# Patient Record
Sex: Female | Born: 1935 | Race: White | Hispanic: No | Marital: Married | Smoking: Former smoker
Health system: Southern US, Community
[De-identification: ages and names within clinical notes are randomized; demographics above are authoritative.]

---

## 1998-07-14 ENCOUNTER — Ambulatory Visit (HOSPITAL_COMMUNITY): Admission: RE | Admit: 1998-07-14 | Discharge: 1998-07-14 | Payer: Self-pay | Admitting: Endocrinology

## 1999-07-20 ENCOUNTER — Encounter: Payer: Self-pay | Admitting: Endocrinology

## 1999-07-20 ENCOUNTER — Ambulatory Visit (HOSPITAL_COMMUNITY): Admission: RE | Admit: 1999-07-20 | Discharge: 1999-07-20 | Payer: Self-pay | Admitting: Endocrinology

## 2000-03-19 ENCOUNTER — Emergency Department (HOSPITAL_COMMUNITY): Admission: EM | Admit: 2000-03-19 | Discharge: 2000-03-19 | Payer: Self-pay | Admitting: Emergency Medicine

## 2000-07-21 ENCOUNTER — Encounter: Payer: Self-pay | Admitting: Endocrinology

## 2000-07-21 ENCOUNTER — Ambulatory Visit (HOSPITAL_COMMUNITY): Admission: RE | Admit: 2000-07-21 | Discharge: 2000-07-21 | Payer: Self-pay | Admitting: Endocrinology

## 2001-07-24 ENCOUNTER — Encounter: Admission: RE | Admit: 2001-07-24 | Discharge: 2001-07-24 | Payer: Self-pay | Admitting: Family Medicine

## 2001-07-24 ENCOUNTER — Encounter: Payer: Self-pay | Admitting: Family Medicine

## 2001-08-09 ENCOUNTER — Ambulatory Visit (HOSPITAL_COMMUNITY): Admission: RE | Admit: 2001-08-09 | Discharge: 2001-08-09 | Payer: Self-pay | Admitting: Family Medicine

## 2001-08-09 ENCOUNTER — Encounter: Payer: Self-pay | Admitting: Family Medicine

## 2002-08-12 ENCOUNTER — Encounter: Payer: Self-pay | Admitting: Family Medicine

## 2002-08-12 ENCOUNTER — Ambulatory Visit (HOSPITAL_COMMUNITY): Admission: RE | Admit: 2002-08-12 | Discharge: 2002-08-12 | Payer: Self-pay | Admitting: Family Medicine

## 2003-08-27 ENCOUNTER — Ambulatory Visit (HOSPITAL_COMMUNITY): Admission: RE | Admit: 2003-08-27 | Discharge: 2003-08-27 | Payer: Self-pay | Admitting: Family Medicine

## 2004-08-27 ENCOUNTER — Ambulatory Visit (HOSPITAL_COMMUNITY): Admission: RE | Admit: 2004-08-27 | Discharge: 2004-08-27 | Payer: Self-pay | Admitting: Family Medicine

## 2005-09-16 ENCOUNTER — Ambulatory Visit (HOSPITAL_COMMUNITY): Admission: RE | Admit: 2005-09-16 | Discharge: 2005-09-16 | Payer: Self-pay | Admitting: Family Medicine

## 2006-06-27 ENCOUNTER — Other Ambulatory Visit: Admission: RE | Admit: 2006-06-27 | Discharge: 2006-06-27 | Payer: Self-pay | Admitting: Family Medicine

## 2006-09-26 ENCOUNTER — Ambulatory Visit (HOSPITAL_COMMUNITY): Admission: RE | Admit: 2006-09-26 | Discharge: 2006-09-26 | Payer: Self-pay | Admitting: Family Medicine

## 2006-11-14 ENCOUNTER — Encounter: Admission: RE | Admit: 2006-11-14 | Discharge: 2006-11-14 | Payer: Self-pay | Admitting: Family Medicine

## 2007-10-09 ENCOUNTER — Ambulatory Visit (HOSPITAL_COMMUNITY): Admission: RE | Admit: 2007-10-09 | Discharge: 2007-10-09 | Payer: Self-pay | Admitting: Family Medicine

## 2008-05-13 ENCOUNTER — Ambulatory Visit: Payer: Self-pay | Admitting: Internal Medicine

## 2008-10-21 ENCOUNTER — Ambulatory Visit (HOSPITAL_COMMUNITY): Admission: RE | Admit: 2008-10-21 | Discharge: 2008-10-21 | Payer: Self-pay | Admitting: Family Medicine

## 2009-07-02 ENCOUNTER — Encounter: Admission: RE | Admit: 2009-07-02 | Discharge: 2009-07-02 | Payer: Self-pay | Admitting: Family Medicine

## 2009-11-11 ENCOUNTER — Ambulatory Visit (HOSPITAL_COMMUNITY): Admission: RE | Admit: 2009-11-11 | Discharge: 2009-11-11 | Payer: Self-pay | Admitting: Family Medicine

## 2010-07-30 ENCOUNTER — Encounter
Admission: RE | Admit: 2010-07-30 | Discharge: 2010-07-30 | Payer: Self-pay | Source: Home / Self Care | Attending: Family Medicine | Admitting: Family Medicine

## 2010-08-01 ENCOUNTER — Emergency Department (HOSPITAL_COMMUNITY)
Admission: EM | Admit: 2010-08-01 | Discharge: 2010-08-01 | Payer: Self-pay | Source: Home / Self Care | Admitting: Emergency Medicine

## 2010-09-08 ENCOUNTER — Encounter
Admission: RE | Admit: 2010-09-08 | Discharge: 2010-09-08 | Payer: Self-pay | Source: Home / Self Care | Attending: Gastroenterology | Admitting: Gastroenterology

## 2010-10-12 ENCOUNTER — Other Ambulatory Visit: Payer: Self-pay | Admitting: Family Medicine

## 2010-10-12 DIAGNOSIS — M549 Dorsalgia, unspecified: Secondary | ICD-10-CM

## 2010-10-17 ENCOUNTER — Ambulatory Visit
Admission: RE | Admit: 2010-10-17 | Discharge: 2010-10-17 | Disposition: A | Discharge status: Home / Self Care | Payer: Medicare Other | Source: Ambulatory Visit | Attending: Family Medicine | Admitting: Family Medicine

## 2010-10-17 DIAGNOSIS — M549 Dorsalgia, unspecified: Secondary | ICD-10-CM

## 2010-11-02 LAB — DIFFERENTIAL
Basophils Absolute: 0 10*3/uL (ref 0.0–0.1)
Basophils Relative: 1 % (ref 0–1)
Eosinophils Absolute: 0 10*3/uL (ref 0.0–0.7)
Lymphocytes Relative: 34 % (ref 12–46)
Neutro Abs: 2.2 10*3/uL (ref 1.7–7.7)

## 2010-11-02 LAB — CBC
MCHC: 34.8 g/dL (ref 30.0–36.0)
MCV: 92.4 fL (ref 78.0–100.0)
RDW: 12.9 % (ref 11.5–15.5)

## 2010-11-02 LAB — URINE CULTURE
Colony Count: NO GROWTH
Culture  Setup Time: 201112112112
Culture: NO GROWTH

## 2010-11-02 LAB — POCT CARDIAC MARKERS: CKMB, poc: <1 ng/mL — ABNORMAL LOW (ref 1.0–8.0)

## 2010-11-02 LAB — URINALYSIS, ROUTINE W REFLEX MICROSCOPIC
Glucose, UA: NEGATIVE mg/dL
Nitrite: NEGATIVE
Protein, ur: NEGATIVE mg/dL
Specific Gravity, Urine: 1.008 (ref 1.005–1.030)

## 2010-11-02 LAB — COMPREHENSIVE METABOLIC PANEL
ALT: 25 U/L (ref 0–35)
AST: 26 U/L (ref 0–37)
BUN: 10 mg/dL (ref 6–23)
CO2: 28 mEq/L (ref 19–32)
Creatinine, Ser: 0.85 mg/dL (ref 0.4–1.2)
GFR calc Af Amer: >60 mL/min (ref >60)
Glucose, Bld: 104 mg/dL — ABNORMAL HIGH (ref 70–99)
Potassium: 3.6 mEq/L (ref 3.5–5.1)
Total Protein: 6.6 g/dL (ref 6.0–8.3)

## 2010-11-02 LAB — LIPASE, BLOOD: Lipase: 27 U/L (ref 11–59)

## 2010-11-02 LAB — URINE MICROSCOPIC-ADD ON

## 2010-12-31 ENCOUNTER — Other Ambulatory Visit: Payer: Self-pay | Admitting: Family Medicine

## 2010-12-31 ENCOUNTER — Ambulatory Visit
Admission: RE | Admit: 2010-12-31 | Discharge: 2010-12-31 | Disposition: A | Discharge status: Home / Self Care | Payer: Medicare Other | Source: Ambulatory Visit | Attending: Family Medicine | Admitting: Family Medicine

## 2010-12-31 DIAGNOSIS — R05 Cough: Secondary | ICD-10-CM

## 2010-12-31 DIAGNOSIS — R059 Cough, unspecified: Secondary | ICD-10-CM

## 2011-01-28 ENCOUNTER — Other Ambulatory Visit (HOSPITAL_COMMUNITY): Payer: Self-pay | Admitting: Family Medicine

## 2011-01-28 DIAGNOSIS — Z1231 Encounter for screening mammogram for malignant neoplasm of breast: Secondary | ICD-10-CM

## 2011-01-31 ENCOUNTER — Ambulatory Visit (HOSPITAL_COMMUNITY)
Admission: RE | Admit: 2011-01-31 | Discharge: 2011-01-31 | Disposition: A | Discharge status: Home / Self Care | Payer: Medicare Other | Source: Ambulatory Visit | Attending: Family Medicine | Admitting: Family Medicine

## 2011-01-31 DIAGNOSIS — Z1231 Encounter for screening mammogram for malignant neoplasm of breast: Secondary | ICD-10-CM | POA: Insufficient documentation

## 2011-05-22 ENCOUNTER — Emergency Department (HOSPITAL_COMMUNITY)
Admission: EM | Admit: 2011-05-22 | Discharge: 2011-05-22 | Disposition: A | Discharge status: Home / Self Care | Payer: Medicare Other | Attending: Emergency Medicine | Admitting: Emergency Medicine

## 2011-05-22 DIAGNOSIS — E7800 Pure hypercholesterolemia, unspecified: Secondary | ICD-10-CM | POA: Insufficient documentation

## 2011-05-22 DIAGNOSIS — R51 Headache: Secondary | ICD-10-CM | POA: Insufficient documentation

## 2011-05-22 DIAGNOSIS — I440 Atrioventricular block, first degree: Secondary | ICD-10-CM | POA: Insufficient documentation

## 2011-05-22 DIAGNOSIS — Z79899 Other long term (current) drug therapy: Secondary | ICD-10-CM | POA: Insufficient documentation

## 2011-05-22 DIAGNOSIS — I10 Essential (primary) hypertension: Secondary | ICD-10-CM | POA: Insufficient documentation

## 2011-05-22 DIAGNOSIS — M542 Cervicalgia: Secondary | ICD-10-CM | POA: Insufficient documentation

## 2011-05-26 ENCOUNTER — Other Ambulatory Visit: Payer: Self-pay | Admitting: Anesthesiology

## 2011-05-26 DIAGNOSIS — M542 Cervicalgia: Secondary | ICD-10-CM

## 2011-05-29 ENCOUNTER — Ambulatory Visit
Admission: RE | Admit: 2011-05-29 | Discharge: 2011-05-29 | Disposition: A | Discharge status: Home / Self Care | Payer: Medicare Other | Source: Ambulatory Visit | Attending: Anesthesiology | Admitting: Anesthesiology

## 2011-05-29 DIAGNOSIS — M542 Cervicalgia: Secondary | ICD-10-CM

## 2011-08-25 DIAGNOSIS — M545 Low back pain, unspecified: Secondary | ICD-10-CM | POA: Diagnosis not present

## 2011-08-25 DIAGNOSIS — M542 Cervicalgia: Secondary | ICD-10-CM | POA: Diagnosis not present

## 2011-09-07 DIAGNOSIS — R059 Cough, unspecified: Secondary | ICD-10-CM | POA: Diagnosis not present

## 2011-09-07 DIAGNOSIS — R05 Cough: Secondary | ICD-10-CM | POA: Diagnosis not present

## 2011-09-19 DIAGNOSIS — M546 Pain in thoracic spine: Secondary | ICD-10-CM | POA: Diagnosis not present

## 2011-09-19 DIAGNOSIS — IMO0002 Reserved for concepts with insufficient information to code with codable children: Secondary | ICD-10-CM | POA: Diagnosis not present

## 2011-09-23 DIAGNOSIS — H9209 Otalgia, unspecified ear: Secondary | ICD-10-CM | POA: Diagnosis not present

## 2011-09-29 DIAGNOSIS — H9319 Tinnitus, unspecified ear: Secondary | ICD-10-CM | POA: Diagnosis not present

## 2011-09-29 DIAGNOSIS — H903 Sensorineural hearing loss, bilateral: Secondary | ICD-10-CM | POA: Diagnosis not present

## 2011-09-30 DIAGNOSIS — M546 Pain in thoracic spine: Secondary | ICD-10-CM | POA: Diagnosis not present

## 2011-10-13 DIAGNOSIS — F411 Generalized anxiety disorder: Secondary | ICD-10-CM | POA: Diagnosis not present

## 2011-10-13 DIAGNOSIS — I10 Essential (primary) hypertension: Secondary | ICD-10-CM | POA: Diagnosis not present

## 2011-10-13 DIAGNOSIS — H9319 Tinnitus, unspecified ear: Secondary | ICD-10-CM | POA: Diagnosis not present

## 2011-10-17 DIAGNOSIS — H9319 Tinnitus, unspecified ear: Secondary | ICD-10-CM | POA: Diagnosis not present

## 2011-10-24 DIAGNOSIS — M546 Pain in thoracic spine: Secondary | ICD-10-CM | POA: Diagnosis not present

## 2011-10-24 DIAGNOSIS — M542 Cervicalgia: Secondary | ICD-10-CM | POA: Diagnosis not present

## 2011-11-09 DIAGNOSIS — G43109 Migraine with aura, not intractable, without status migrainosus: Secondary | ICD-10-CM | POA: Diagnosis not present

## 2011-11-09 DIAGNOSIS — I10 Essential (primary) hypertension: Secondary | ICD-10-CM | POA: Diagnosis not present

## 2011-11-09 DIAGNOSIS — J309 Allergic rhinitis, unspecified: Secondary | ICD-10-CM | POA: Diagnosis not present

## 2011-11-22 DIAGNOSIS — H9319 Tinnitus, unspecified ear: Secondary | ICD-10-CM | POA: Diagnosis not present

## 2011-11-22 DIAGNOSIS — H9209 Otalgia, unspecified ear: Secondary | ICD-10-CM | POA: Diagnosis not present

## 2011-11-22 DIAGNOSIS — H905 Unspecified sensorineural hearing loss: Secondary | ICD-10-CM | POA: Diagnosis not present

## 2011-12-21 DIAGNOSIS — M546 Pain in thoracic spine: Secondary | ICD-10-CM | POA: Diagnosis not present

## 2011-12-21 DIAGNOSIS — M542 Cervicalgia: Secondary | ICD-10-CM | POA: Diagnosis not present

## 2012-01-20 DIAGNOSIS — M76829 Posterior tibial tendinitis, unspecified leg: Secondary | ICD-10-CM | POA: Diagnosis not present

## 2012-01-30 DIAGNOSIS — M5030 Other cervical disc degeneration, unspecified cervical region: Secondary | ICD-10-CM | POA: Diagnosis not present

## 2012-01-30 DIAGNOSIS — M5412 Radiculopathy, cervical region: Secondary | ICD-10-CM | POA: Diagnosis not present

## 2012-02-06 DIAGNOSIS — H5210 Myopia, unspecified eye: Secondary | ICD-10-CM | POA: Diagnosis not present

## 2012-02-06 DIAGNOSIS — H2510 Age-related nuclear cataract, unspecified eye: Secondary | ICD-10-CM | POA: Diagnosis not present

## 2012-02-06 DIAGNOSIS — H52229 Regular astigmatism, unspecified eye: Secondary | ICD-10-CM | POA: Diagnosis not present

## 2012-02-08 DIAGNOSIS — E7800 Pure hypercholesterolemia, unspecified: Secondary | ICD-10-CM | POA: Diagnosis not present

## 2012-02-10 DIAGNOSIS — E7800 Pure hypercholesterolemia, unspecified: Secondary | ICD-10-CM | POA: Diagnosis not present

## 2012-02-10 DIAGNOSIS — I10 Essential (primary) hypertension: Secondary | ICD-10-CM | POA: Diagnosis not present

## 2012-02-22 IMAGING — CT CT ABD-PELV W/ CM
3 of 5 series · 13 of 36 positions shown, 19 images · IV contrast (READICAT/WATER & [ID] OMNI 300)
Comparison: [HOSPITAL] at [REDACTED] [HOSPITAL] abdominal pelvic
CT 07/02/2009.

CLINICAL DATA: Diffuse abdominal pain with diverticular disease.
Antibiotic treatment.  Hysterectomy.  Appendectomy.

CT ABDOMEN AND PELVIS WITH CONTRAST
TECHNIQUE: Multidetector CT imaging of the abdomen and pelvis was
performed following the standard protocol during bolus
administration of intravenous contrast.
Contrast: Intravenous 100 ml Qmnipaque-SJJ.

[Series 3: routine abdomen · axial · 0.70mm/px · z∈[-309,-9]mm · 7 of 81 slices shown, 12 images]
[im 11/81  soft-tissue]
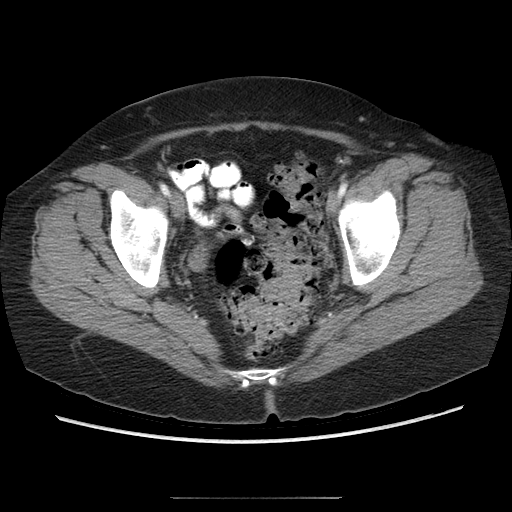
[im 11/81  bone]
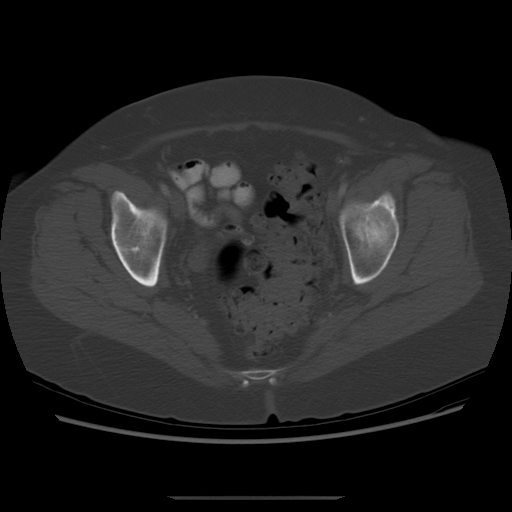
[im 21/81  soft-tissue]
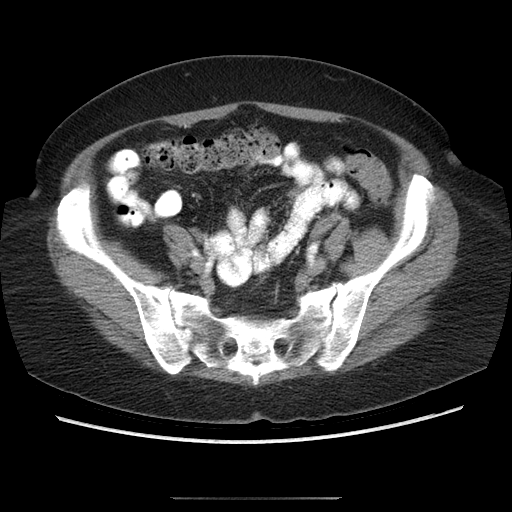
[im 31/81  soft-tissue]
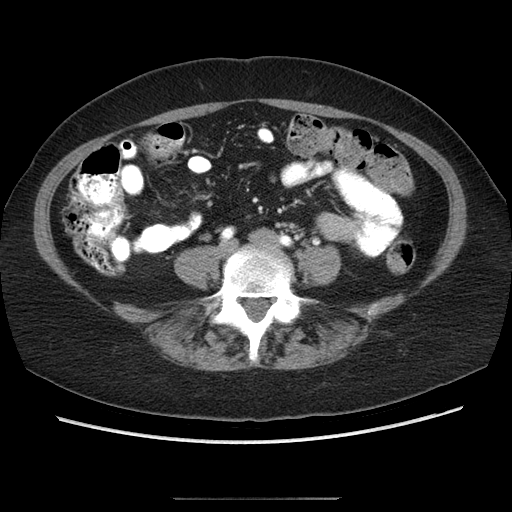
[im 41/81  soft-tissue]
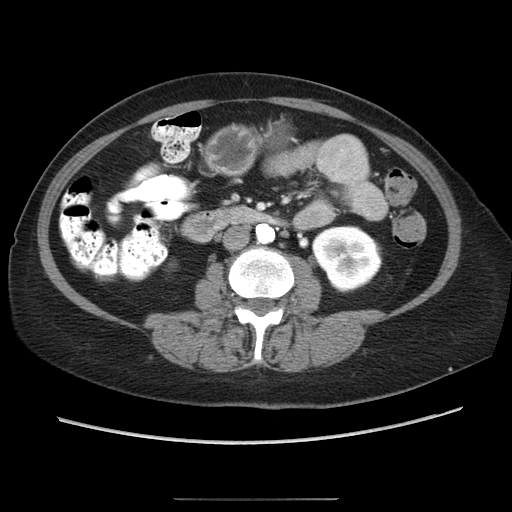
[im 41/81  lung]
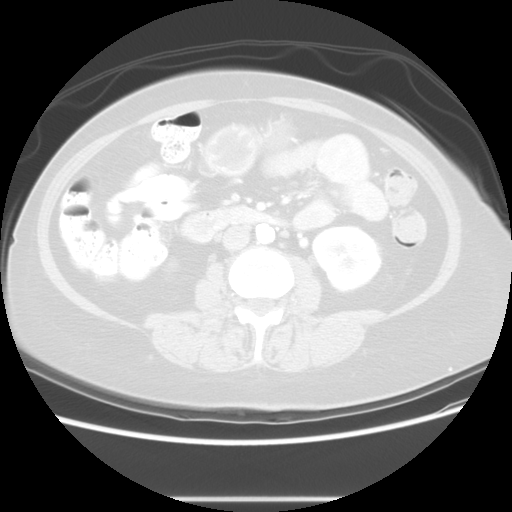
[im 51/81  soft-tissue]
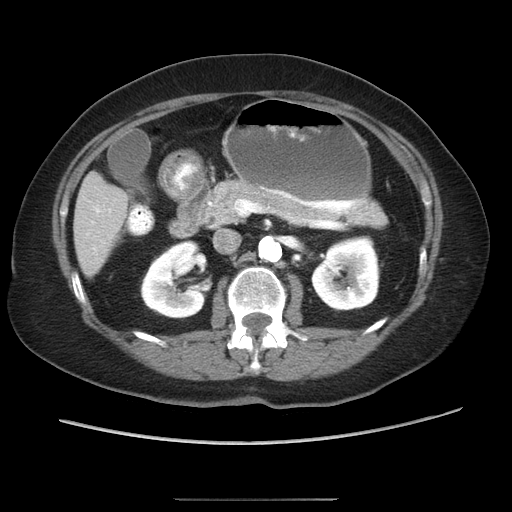
[im 51/81  lung]
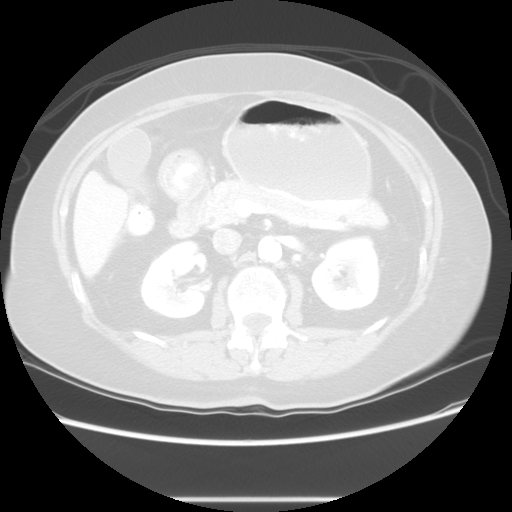
[im 61/81  soft-tissue]
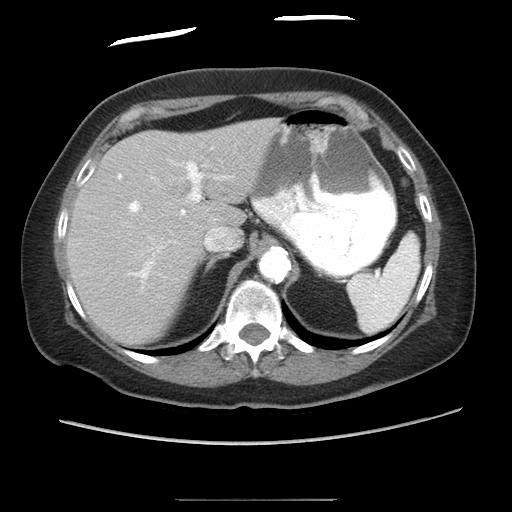
[im 61/81  lung]
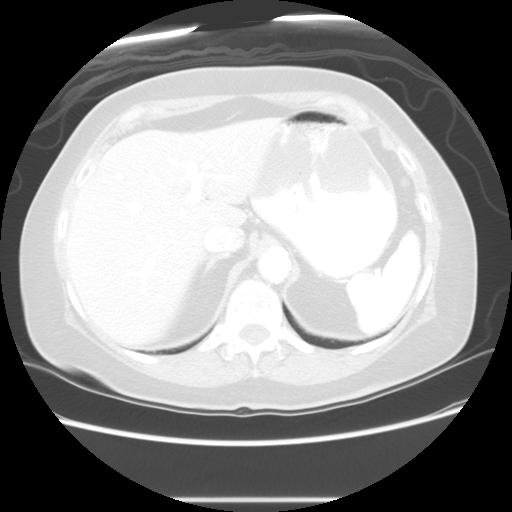
[im 71/81  soft-tissue]
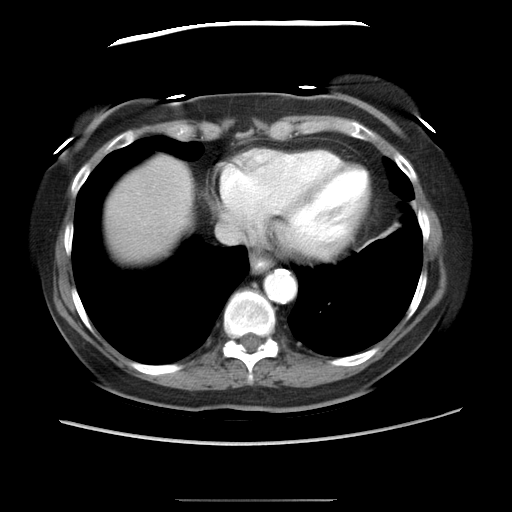
[im 71/81  lung]
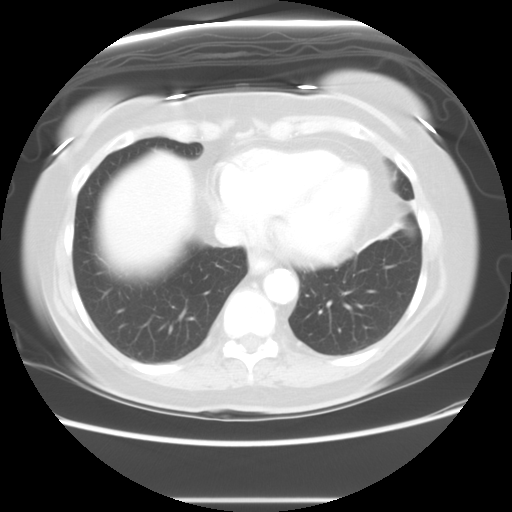

[Series 601: coronal body · coronal · 0.89mm/px · 1 of 108 slices shown, 2 images]
[im 36/108  soft-tissue]
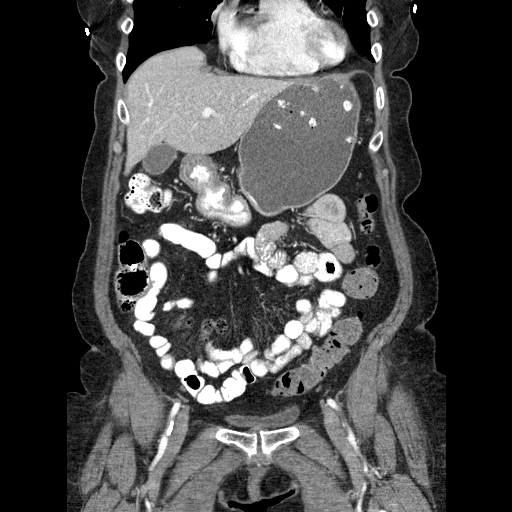
[im 36/108  bone]
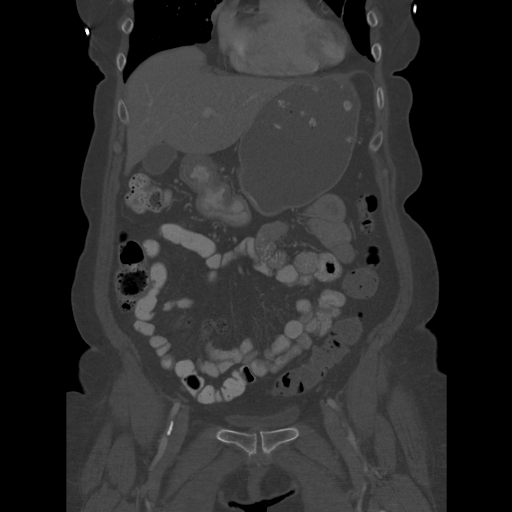

[Series 602: sagittal body · sagittal · 0.89mm/px · 5 of 145 slices shown]
[im 10/145  soft-tissue]
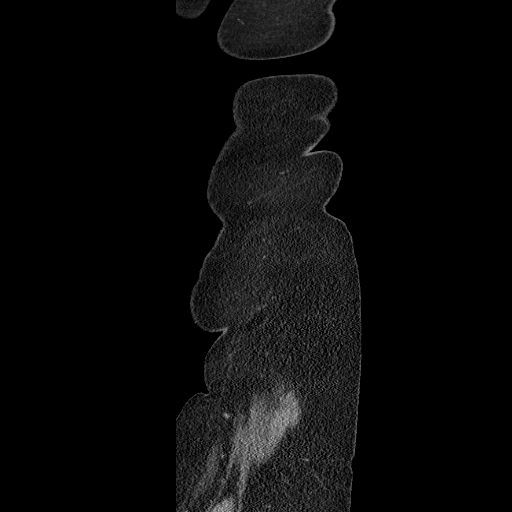
[im 28/145  soft-tissue]
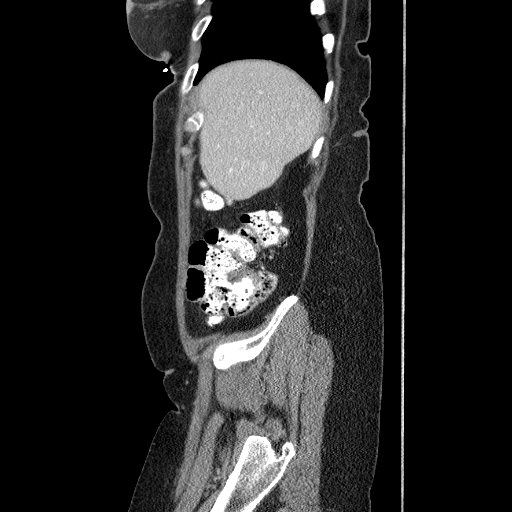
[im 46/145  soft-tissue]
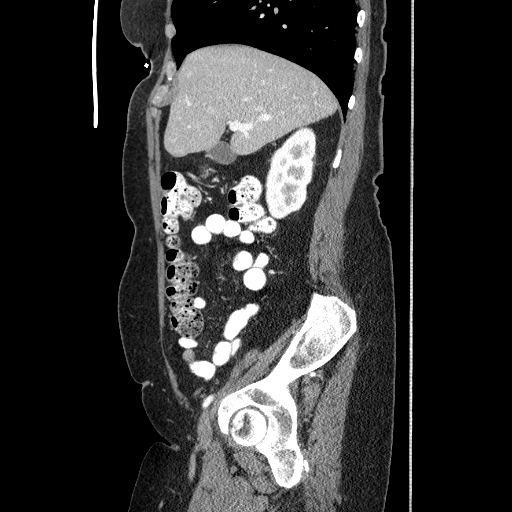
[im 64/145  soft-tissue]
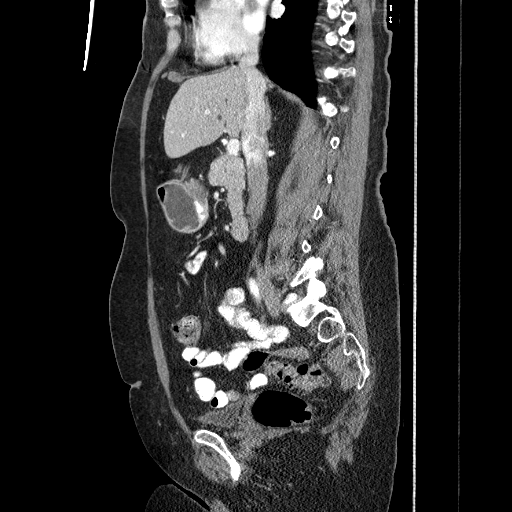
[im 82/145  soft-tissue]
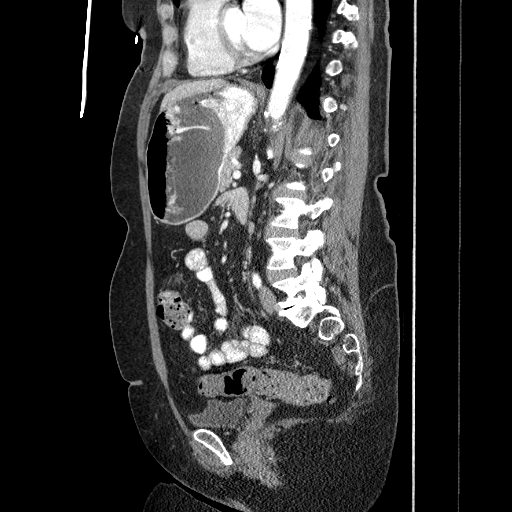

[13 of 36 positions shown; findings below may reference images not displayed]

FINDINGS: Current moderate retained colonic feces without
intestinal obstruction or inflammatory bowel disease visualized.
Stable slight scattered diverticulosis most marked at the sigmoid
colon levels seen with no evidence for acute diverticulitis,
abscess, free fluid or adenopathy.  Stable 5 mm lateral segment
left hepatic cyst, small hiatus hernia, moderate atheromatous
vascular calcification with normal sized abdominal aorta,
hysterectomy, appendectomy, slight to moderate degenerative disc
disease L5-S1 visualized.  Remaining abdominal and pelvic organs
appear normal with slight left inferior lingular linear scarring or
atelectasis with lung bases otherwise clear.
IMPRESSION: 1.  Current moderate retained colonic feces may represent
constipation.
2.  Stable diverticulosis most marked sigmoid level without acute
diverticulitis or complicating features.
3.  Stable benign appearing chronic and postoperative findings.
3.  No acute abnormality.

## 2012-02-29 DIAGNOSIS — M542 Cervicalgia: Secondary | ICD-10-CM | POA: Diagnosis not present

## 2012-04-04 DIAGNOSIS — M76829 Posterior tibial tendinitis, unspecified leg: Secondary | ICD-10-CM | POA: Diagnosis not present

## 2012-04-07 DIAGNOSIS — M19079 Primary osteoarthritis, unspecified ankle and foot: Secondary | ICD-10-CM | POA: Diagnosis not present

## 2012-04-18 DIAGNOSIS — M25579 Pain in unspecified ankle and joints of unspecified foot: Secondary | ICD-10-CM | POA: Diagnosis not present

## 2012-04-18 DIAGNOSIS — M76829 Posterior tibial tendinitis, unspecified leg: Secondary | ICD-10-CM | POA: Diagnosis not present

## 2012-04-25 DIAGNOSIS — M25579 Pain in unspecified ankle and joints of unspecified foot: Secondary | ICD-10-CM | POA: Diagnosis not present

## 2012-04-25 DIAGNOSIS — M76829 Posterior tibial tendinitis, unspecified leg: Secondary | ICD-10-CM | POA: Diagnosis not present

## 2012-04-26 DIAGNOSIS — M25579 Pain in unspecified ankle and joints of unspecified foot: Secondary | ICD-10-CM | POA: Diagnosis not present

## 2012-04-26 DIAGNOSIS — M76829 Posterior tibial tendinitis, unspecified leg: Secondary | ICD-10-CM | POA: Diagnosis not present

## 2012-05-01 DIAGNOSIS — M25579 Pain in unspecified ankle and joints of unspecified foot: Secondary | ICD-10-CM | POA: Diagnosis not present

## 2012-05-01 DIAGNOSIS — M76829 Posterior tibial tendinitis, unspecified leg: Secondary | ICD-10-CM | POA: Diagnosis not present

## 2012-05-03 DIAGNOSIS — M25579 Pain in unspecified ankle and joints of unspecified foot: Secondary | ICD-10-CM | POA: Diagnosis not present

## 2012-05-03 DIAGNOSIS — M76829 Posterior tibial tendinitis, unspecified leg: Secondary | ICD-10-CM | POA: Diagnosis not present

## 2012-05-09 DIAGNOSIS — M25579 Pain in unspecified ankle and joints of unspecified foot: Secondary | ICD-10-CM | POA: Diagnosis not present

## 2012-05-09 DIAGNOSIS — M76829 Posterior tibial tendinitis, unspecified leg: Secondary | ICD-10-CM | POA: Diagnosis not present

## 2012-05-10 DIAGNOSIS — M25579 Pain in unspecified ankle and joints of unspecified foot: Secondary | ICD-10-CM | POA: Diagnosis not present

## 2012-05-10 DIAGNOSIS — M76829 Posterior tibial tendinitis, unspecified leg: Secondary | ICD-10-CM | POA: Diagnosis not present

## 2012-05-11 DIAGNOSIS — E7800 Pure hypercholesterolemia, unspecified: Secondary | ICD-10-CM | POA: Diagnosis not present

## 2012-05-16 DIAGNOSIS — M76829 Posterior tibial tendinitis, unspecified leg: Secondary | ICD-10-CM | POA: Diagnosis not present

## 2012-05-16 DIAGNOSIS — M25579 Pain in unspecified ankle and joints of unspecified foot: Secondary | ICD-10-CM | POA: Diagnosis not present

## 2012-05-23 DIAGNOSIS — M76829 Posterior tibial tendinitis, unspecified leg: Secondary | ICD-10-CM | POA: Diagnosis not present

## 2012-05-23 DIAGNOSIS — M25579 Pain in unspecified ankle and joints of unspecified foot: Secondary | ICD-10-CM | POA: Diagnosis not present

## 2012-05-25 DIAGNOSIS — M76829 Posterior tibial tendinitis, unspecified leg: Secondary | ICD-10-CM | POA: Diagnosis not present

## 2012-05-25 DIAGNOSIS — M25579 Pain in unspecified ankle and joints of unspecified foot: Secondary | ICD-10-CM | POA: Diagnosis not present

## 2012-05-30 DIAGNOSIS — M25579 Pain in unspecified ankle and joints of unspecified foot: Secondary | ICD-10-CM | POA: Diagnosis not present

## 2012-05-30 DIAGNOSIS — M76829 Posterior tibial tendinitis, unspecified leg: Secondary | ICD-10-CM | POA: Diagnosis not present

## 2012-05-31 DIAGNOSIS — M76829 Posterior tibial tendinitis, unspecified leg: Secondary | ICD-10-CM | POA: Diagnosis not present

## 2012-05-31 DIAGNOSIS — M25579 Pain in unspecified ankle and joints of unspecified foot: Secondary | ICD-10-CM | POA: Diagnosis not present

## 2012-06-20 DIAGNOSIS — R42 Dizziness and giddiness: Secondary | ICD-10-CM | POA: Diagnosis not present

## 2012-07-04 DIAGNOSIS — L821 Other seborrheic keratosis: Secondary | ICD-10-CM | POA: Diagnosis not present

## 2012-07-04 DIAGNOSIS — D235 Other benign neoplasm of skin of trunk: Secondary | ICD-10-CM | POA: Diagnosis not present

## 2012-07-04 DIAGNOSIS — L820 Inflamed seborrheic keratosis: Secondary | ICD-10-CM | POA: Diagnosis not present

## 2012-07-04 DIAGNOSIS — D485 Neoplasm of uncertain behavior of skin: Secondary | ICD-10-CM | POA: Diagnosis not present

## 2012-07-04 DIAGNOSIS — Z85828 Personal history of other malignant neoplasm of skin: Secondary | ICD-10-CM | POA: Diagnosis not present

## 2012-08-10 DIAGNOSIS — E7800 Pure hypercholesterolemia, unspecified: Secondary | ICD-10-CM | POA: Diagnosis not present

## 2012-08-10 DIAGNOSIS — F411 Generalized anxiety disorder: Secondary | ICD-10-CM | POA: Diagnosis not present

## 2012-08-10 DIAGNOSIS — I10 Essential (primary) hypertension: Secondary | ICD-10-CM | POA: Diagnosis not present

## 2012-08-10 DIAGNOSIS — IMO0002 Reserved for concepts with insufficient information to code with codable children: Secondary | ICD-10-CM | POA: Diagnosis not present

## 2012-08-10 DIAGNOSIS — K5732 Diverticulitis of large intestine without perforation or abscess without bleeding: Secondary | ICD-10-CM | POA: Diagnosis not present

## 2012-09-25 DIAGNOSIS — K5732 Diverticulitis of large intestine without perforation or abscess without bleeding: Secondary | ICD-10-CM | POA: Diagnosis not present

## 2012-11-13 DIAGNOSIS — J309 Allergic rhinitis, unspecified: Secondary | ICD-10-CM | POA: Diagnosis not present

## 2012-11-13 DIAGNOSIS — Z23 Encounter for immunization: Secondary | ICD-10-CM | POA: Diagnosis not present

## 2012-11-13 DIAGNOSIS — G43109 Migraine with aura, not intractable, without status migrainosus: Secondary | ICD-10-CM | POA: Diagnosis not present

## 2012-11-13 DIAGNOSIS — I10 Essential (primary) hypertension: Secondary | ICD-10-CM | POA: Diagnosis not present

## 2012-11-13 DIAGNOSIS — F411 Generalized anxiety disorder: Secondary | ICD-10-CM | POA: Diagnosis not present

## 2012-11-13 DIAGNOSIS — IMO0002 Reserved for concepts with insufficient information to code with codable children: Secondary | ICD-10-CM | POA: Diagnosis not present

## 2012-11-13 DIAGNOSIS — Z0000 Encounter for general adult medical examination without abnormal findings: Secondary | ICD-10-CM | POA: Diagnosis not present

## 2012-11-13 DIAGNOSIS — E7800 Pure hypercholesterolemia, unspecified: Secondary | ICD-10-CM | POA: Diagnosis not present

## 2012-11-16 ENCOUNTER — Other Ambulatory Visit (HOSPITAL_COMMUNITY): Payer: Self-pay | Admitting: Family Medicine

## 2012-11-16 DIAGNOSIS — Z1231 Encounter for screening mammogram for malignant neoplasm of breast: Secondary | ICD-10-CM

## 2012-11-19 ENCOUNTER — Ambulatory Visit (HOSPITAL_COMMUNITY)
Admission: RE | Admit: 2012-11-19 | Discharge: 2012-11-19 | Disposition: A | Discharge status: Home / Self Care | Payer: Medicare Other | Source: Ambulatory Visit | Attending: Family Medicine | Admitting: Family Medicine

## 2012-11-19 DIAGNOSIS — Z1231 Encounter for screening mammogram for malignant neoplasm of breast: Secondary | ICD-10-CM

## 2012-12-27 DIAGNOSIS — H903 Sensorineural hearing loss, bilateral: Secondary | ICD-10-CM | POA: Diagnosis not present

## 2013-01-02 DIAGNOSIS — J309 Allergic rhinitis, unspecified: Secondary | ICD-10-CM | POA: Diagnosis not present

## 2013-01-28 DIAGNOSIS — H0230 Blepharochalasis unspecified eye, unspecified eyelid: Secondary | ICD-10-CM | POA: Diagnosis not present

## 2013-01-28 DIAGNOSIS — H01009 Unspecified blepharitis unspecified eye, unspecified eyelid: Secondary | ICD-10-CM | POA: Diagnosis not present

## 2013-01-28 DIAGNOSIS — H16109 Unspecified superficial keratitis, unspecified eye: Secondary | ICD-10-CM | POA: Diagnosis not present

## 2013-01-28 DIAGNOSIS — H04129 Dry eye syndrome of unspecified lacrimal gland: Secondary | ICD-10-CM | POA: Diagnosis not present

## 2013-01-30 DIAGNOSIS — T7840XA Allergy, unspecified, initial encounter: Secondary | ICD-10-CM | POA: Diagnosis not present

## 2013-02-01 DIAGNOSIS — R42 Dizziness and giddiness: Secondary | ICD-10-CM | POA: Diagnosis not present

## 2013-02-01 DIAGNOSIS — J019 Acute sinusitis, unspecified: Secondary | ICD-10-CM | POA: Diagnosis not present

## 2013-03-21 DIAGNOSIS — M5030 Other cervical disc degeneration, unspecified cervical region: Secondary | ICD-10-CM | POA: Diagnosis not present

## 2013-03-21 DIAGNOSIS — M5412 Radiculopathy, cervical region: Secondary | ICD-10-CM | POA: Diagnosis not present

## 2013-05-08 DIAGNOSIS — E7800 Pure hypercholesterolemia, unspecified: Secondary | ICD-10-CM | POA: Diagnosis not present

## 2013-05-08 DIAGNOSIS — I10 Essential (primary) hypertension: Secondary | ICD-10-CM | POA: Diagnosis not present

## 2013-07-04 DIAGNOSIS — H8110 Benign paroxysmal vertigo, unspecified ear: Secondary | ICD-10-CM | POA: Diagnosis not present

## 2013-07-05 DIAGNOSIS — L723 Sebaceous cyst: Secondary | ICD-10-CM | POA: Diagnosis not present

## 2013-07-05 DIAGNOSIS — L821 Other seborrheic keratosis: Secondary | ICD-10-CM | POA: Diagnosis not present

## 2013-07-05 DIAGNOSIS — L570 Actinic keratosis: Secondary | ICD-10-CM | POA: Diagnosis not present

## 2013-07-05 DIAGNOSIS — D239 Other benign neoplasm of skin, unspecified: Secondary | ICD-10-CM | POA: Diagnosis not present

## 2013-07-09 DIAGNOSIS — H6120 Impacted cerumen, unspecified ear: Secondary | ICD-10-CM | POA: Diagnosis not present

## 2013-07-09 DIAGNOSIS — H905 Unspecified sensorineural hearing loss: Secondary | ICD-10-CM | POA: Diagnosis not present

## 2013-10-31 DIAGNOSIS — E669 Obesity, unspecified: Secondary | ICD-10-CM | POA: Diagnosis not present

## 2013-10-31 DIAGNOSIS — M5030 Other cervical disc degeneration, unspecified cervical region: Secondary | ICD-10-CM | POA: Diagnosis not present

## 2013-10-31 DIAGNOSIS — M5412 Radiculopathy, cervical region: Secondary | ICD-10-CM | POA: Diagnosis not present

## 2013-11-12 DIAGNOSIS — Z23 Encounter for immunization: Secondary | ICD-10-CM | POA: Diagnosis not present

## 2013-11-12 DIAGNOSIS — F411 Generalized anxiety disorder: Secondary | ICD-10-CM | POA: Diagnosis not present

## 2013-11-12 DIAGNOSIS — G43109 Migraine with aura, not intractable, without status migrainosus: Secondary | ICD-10-CM | POA: Diagnosis not present

## 2013-11-12 DIAGNOSIS — E7800 Pure hypercholesterolemia, unspecified: Secondary | ICD-10-CM | POA: Diagnosis not present

## 2013-11-12 DIAGNOSIS — J309 Allergic rhinitis, unspecified: Secondary | ICD-10-CM | POA: Diagnosis not present

## 2013-11-12 DIAGNOSIS — Z0000 Encounter for general adult medical examination without abnormal findings: Secondary | ICD-10-CM | POA: Diagnosis not present

## 2013-11-12 DIAGNOSIS — I10 Essential (primary) hypertension: Secondary | ICD-10-CM | POA: Diagnosis not present

## 2013-11-12 DIAGNOSIS — IMO0002 Reserved for concepts with insufficient information to code with codable children: Secondary | ICD-10-CM | POA: Diagnosis not present

## 2013-11-18 DIAGNOSIS — H5210 Myopia, unspecified eye: Secondary | ICD-10-CM | POA: Diagnosis not present

## 2013-11-18 DIAGNOSIS — H524 Presbyopia: Secondary | ICD-10-CM | POA: Diagnosis not present

## 2013-11-18 DIAGNOSIS — H269 Unspecified cataract: Secondary | ICD-10-CM | POA: Diagnosis not present

## 2013-11-18 DIAGNOSIS — H35039 Hypertensive retinopathy, unspecified eye: Secondary | ICD-10-CM | POA: Diagnosis not present

## 2013-11-18 DIAGNOSIS — H52229 Regular astigmatism, unspecified eye: Secondary | ICD-10-CM | POA: Diagnosis not present

## 2013-11-29 ENCOUNTER — Other Ambulatory Visit (HOSPITAL_COMMUNITY): Payer: Self-pay | Admitting: Family Medicine

## 2013-11-29 DIAGNOSIS — Z1231 Encounter for screening mammogram for malignant neoplasm of breast: Secondary | ICD-10-CM

## 2013-12-06 ENCOUNTER — Ambulatory Visit (HOSPITAL_COMMUNITY)
Admission: RE | Admit: 2013-12-06 | Discharge: 2013-12-06 | Disposition: A | Discharge status: Home / Self Care | Payer: Medicare Other | Source: Ambulatory Visit | Attending: Family Medicine | Admitting: Family Medicine

## 2013-12-06 DIAGNOSIS — Z1231 Encounter for screening mammogram for malignant neoplasm of breast: Secondary | ICD-10-CM | POA: Insufficient documentation

## 2013-12-16 DIAGNOSIS — M899 Disorder of bone, unspecified: Secondary | ICD-10-CM | POA: Diagnosis not present

## 2013-12-16 DIAGNOSIS — M949 Disorder of cartilage, unspecified: Secondary | ICD-10-CM | POA: Diagnosis not present

## 2014-05-16 DIAGNOSIS — Z23 Encounter for immunization: Secondary | ICD-10-CM | POA: Diagnosis not present

## 2014-05-16 DIAGNOSIS — E7800 Pure hypercholesterolemia, unspecified: Secondary | ICD-10-CM | POA: Diagnosis not present

## 2014-05-16 DIAGNOSIS — I10 Essential (primary) hypertension: Secondary | ICD-10-CM | POA: Diagnosis not present

## 2014-08-11 DIAGNOSIS — L820 Inflamed seborrheic keratosis: Secondary | ICD-10-CM | POA: Diagnosis not present

## 2014-08-11 DIAGNOSIS — L821 Other seborrheic keratosis: Secondary | ICD-10-CM | POA: Diagnosis not present

## 2014-08-11 DIAGNOSIS — L720 Epidermal cyst: Secondary | ICD-10-CM | POA: Diagnosis not present

## 2014-11-20 DIAGNOSIS — M5415 Radiculopathy, thoracolumbar region: Secondary | ICD-10-CM | POA: Diagnosis not present

## 2014-11-20 DIAGNOSIS — E780 Pure hypercholesterolemia: Secondary | ICD-10-CM | POA: Diagnosis not present

## 2014-11-20 DIAGNOSIS — Z1389 Encounter for screening for other disorder: Secondary | ICD-10-CM | POA: Diagnosis not present

## 2014-11-20 DIAGNOSIS — Z0000 Encounter for general adult medical examination without abnormal findings: Secondary | ICD-10-CM | POA: Diagnosis not present

## 2014-11-20 DIAGNOSIS — G43109 Migraine with aura, not intractable, without status migrainosus: Secondary | ICD-10-CM | POA: Diagnosis not present

## 2014-11-20 DIAGNOSIS — J309 Allergic rhinitis, unspecified: Secondary | ICD-10-CM | POA: Diagnosis not present

## 2014-11-20 DIAGNOSIS — F39 Unspecified mood [affective] disorder: Secondary | ICD-10-CM | POA: Diagnosis not present

## 2014-11-20 DIAGNOSIS — I10 Essential (primary) hypertension: Secondary | ICD-10-CM | POA: Diagnosis not present

## 2014-11-25 ENCOUNTER — Other Ambulatory Visit (HOSPITAL_COMMUNITY): Payer: Self-pay | Admitting: Family Medicine

## 2014-11-25 DIAGNOSIS — Z1231 Encounter for screening mammogram for malignant neoplasm of breast: Secondary | ICD-10-CM

## 2014-12-12 ENCOUNTER — Ambulatory Visit (HOSPITAL_COMMUNITY)
Admission: RE | Admit: 2014-12-12 | Discharge: 2014-12-12 | Disposition: A | Discharge status: Home / Self Care | Payer: Medicare Other | Source: Ambulatory Visit | Attending: Family Medicine | Admitting: Family Medicine

## 2014-12-12 DIAGNOSIS — Z1231 Encounter for screening mammogram for malignant neoplasm of breast: Secondary | ICD-10-CM | POA: Insufficient documentation

## 2015-05-13 DIAGNOSIS — R0989 Other specified symptoms and signs involving the circulatory and respiratory systems: Secondary | ICD-10-CM | POA: Diagnosis not present

## 2015-05-19 DIAGNOSIS — J387 Other diseases of larynx: Secondary | ICD-10-CM | POA: Diagnosis not present

## 2015-05-19 DIAGNOSIS — F458 Other somatoform disorders: Secondary | ICD-10-CM | POA: Diagnosis not present

## 2015-05-22 DIAGNOSIS — Z23 Encounter for immunization: Secondary | ICD-10-CM | POA: Diagnosis not present

## 2015-05-22 DIAGNOSIS — E780 Pure hypercholesterolemia: Secondary | ICD-10-CM | POA: Diagnosis not present

## 2015-05-22 DIAGNOSIS — I10 Essential (primary) hypertension: Secondary | ICD-10-CM | POA: Diagnosis not present

## 2015-06-16 DIAGNOSIS — J387 Other diseases of larynx: Secondary | ICD-10-CM | POA: Diagnosis not present

## 2015-08-23 DIAGNOSIS — J069 Acute upper respiratory infection, unspecified: Secondary | ICD-10-CM | POA: Diagnosis not present

## 2015-08-23 DIAGNOSIS — H6692 Otitis media, unspecified, left ear: Secondary | ICD-10-CM | POA: Diagnosis not present

## 2015-08-27 DIAGNOSIS — E871 Hypo-osmolality and hyponatremia: Secondary | ICD-10-CM | POA: Diagnosis not present

## 2015-09-15 DIAGNOSIS — J387 Other diseases of larynx: Secondary | ICD-10-CM | POA: Diagnosis not present

## 2015-10-05 DIAGNOSIS — L821 Other seborrheic keratosis: Secondary | ICD-10-CM | POA: Diagnosis not present

## 2015-10-05 DIAGNOSIS — L570 Actinic keratosis: Secondary | ICD-10-CM | POA: Diagnosis not present

## 2015-10-05 DIAGNOSIS — L738 Other specified follicular disorders: Secondary | ICD-10-CM | POA: Diagnosis not present

## 2015-10-05 DIAGNOSIS — L820 Inflamed seborrheic keratosis: Secondary | ICD-10-CM | POA: Diagnosis not present

## 2015-10-05 DIAGNOSIS — L718 Other rosacea: Secondary | ICD-10-CM | POA: Diagnosis not present

## 2015-11-17 ENCOUNTER — Other Ambulatory Visit: Payer: Self-pay

## 2015-11-17 DIAGNOSIS — Z1231 Encounter for screening mammogram for malignant neoplasm of breast: Secondary | ICD-10-CM

## 2015-11-30 DIAGNOSIS — G43109 Migraine with aura, not intractable, without status migrainosus: Secondary | ICD-10-CM | POA: Diagnosis not present

## 2015-11-30 DIAGNOSIS — F39 Unspecified mood [affective] disorder: Secondary | ICD-10-CM | POA: Diagnosis not present

## 2015-11-30 DIAGNOSIS — M5415 Radiculopathy, thoracolumbar region: Secondary | ICD-10-CM | POA: Diagnosis not present

## 2015-11-30 DIAGNOSIS — I10 Essential (primary) hypertension: Secondary | ICD-10-CM | POA: Diagnosis not present

## 2015-11-30 DIAGNOSIS — J309 Allergic rhinitis, unspecified: Secondary | ICD-10-CM | POA: Diagnosis not present

## 2015-11-30 DIAGNOSIS — Z0000 Encounter for general adult medical examination without abnormal findings: Secondary | ICD-10-CM | POA: Diagnosis not present

## 2015-11-30 DIAGNOSIS — E7800 Pure hypercholesterolemia, unspecified: Secondary | ICD-10-CM | POA: Diagnosis not present

## 2015-12-14 ENCOUNTER — Ambulatory Visit: Payer: Federal, State, Local not specified - PPO

## 2016-01-25 DIAGNOSIS — H25011 Cortical age-related cataract, right eye: Secondary | ICD-10-CM | POA: Diagnosis not present

## 2016-01-25 DIAGNOSIS — H2511 Age-related nuclear cataract, right eye: Secondary | ICD-10-CM | POA: Diagnosis not present

## 2016-01-25 DIAGNOSIS — H25013 Cortical age-related cataract, bilateral: Secondary | ICD-10-CM | POA: Diagnosis not present

## 2016-01-25 DIAGNOSIS — H2513 Age-related nuclear cataract, bilateral: Secondary | ICD-10-CM | POA: Diagnosis not present

## 2016-02-17 DIAGNOSIS — H25012 Cortical age-related cataract, left eye: Secondary | ICD-10-CM | POA: Diagnosis not present

## 2016-02-17 DIAGNOSIS — H2511 Age-related nuclear cataract, right eye: Secondary | ICD-10-CM | POA: Diagnosis not present

## 2016-02-17 DIAGNOSIS — H2512 Age-related nuclear cataract, left eye: Secondary | ICD-10-CM | POA: Diagnosis not present

## 2016-02-17 DIAGNOSIS — H25011 Cortical age-related cataract, right eye: Secondary | ICD-10-CM | POA: Diagnosis not present

## 2016-02-24 DIAGNOSIS — H2512 Age-related nuclear cataract, left eye: Secondary | ICD-10-CM | POA: Diagnosis not present

## 2016-02-24 DIAGNOSIS — H25012 Cortical age-related cataract, left eye: Secondary | ICD-10-CM | POA: Diagnosis not present

## 2016-05-31 DIAGNOSIS — Z23 Encounter for immunization: Secondary | ICD-10-CM | POA: Diagnosis not present

## 2016-09-12 DIAGNOSIS — Z961 Presence of intraocular lens: Secondary | ICD-10-CM | POA: Diagnosis not present

## 2016-12-09 DIAGNOSIS — J387 Other diseases of larynx: Secondary | ICD-10-CM | POA: Diagnosis not present

## 2016-12-09 DIAGNOSIS — Z0000 Encounter for general adult medical examination without abnormal findings: Secondary | ICD-10-CM | POA: Diagnosis not present

## 2016-12-09 DIAGNOSIS — J309 Allergic rhinitis, unspecified: Secondary | ICD-10-CM | POA: Diagnosis not present

## 2016-12-09 DIAGNOSIS — F39 Unspecified mood [affective] disorder: Secondary | ICD-10-CM | POA: Diagnosis not present

## 2016-12-09 DIAGNOSIS — M5415 Radiculopathy, thoracolumbar region: Secondary | ICD-10-CM | POA: Diagnosis not present

## 2016-12-09 DIAGNOSIS — G43109 Migraine with aura, not intractable, without status migrainosus: Secondary | ICD-10-CM | POA: Diagnosis not present

## 2016-12-09 DIAGNOSIS — I10 Essential (primary) hypertension: Secondary | ICD-10-CM | POA: Diagnosis not present

## 2016-12-09 DIAGNOSIS — E7800 Pure hypercholesterolemia, unspecified: Secondary | ICD-10-CM | POA: Diagnosis not present

## 2016-12-21 DIAGNOSIS — E876 Hypokalemia: Secondary | ICD-10-CM | POA: Diagnosis not present

## 2017-01-11 ENCOUNTER — Other Ambulatory Visit: Payer: Self-pay

## 2017-01-11 ENCOUNTER — Other Ambulatory Visit: Payer: Self-pay | Admitting: Family Medicine

## 2017-01-11 DIAGNOSIS — Z1231 Encounter for screening mammogram for malignant neoplasm of breast: Secondary | ICD-10-CM

## 2017-01-18 ENCOUNTER — Ambulatory Visit
Admission: RE | Admit: 2017-01-18 | Discharge: 2017-01-18 | Disposition: A | Discharge status: Home / Self Care | Payer: Medicare Other | Source: Ambulatory Visit | Attending: Family Medicine | Admitting: Family Medicine

## 2017-01-18 DIAGNOSIS — Z1231 Encounter for screening mammogram for malignant neoplasm of breast: Secondary | ICD-10-CM

## 2017-01-20 DIAGNOSIS — L918 Other hypertrophic disorders of the skin: Secondary | ICD-10-CM | POA: Diagnosis not present

## 2017-01-20 DIAGNOSIS — I788 Other diseases of capillaries: Secondary | ICD-10-CM | POA: Diagnosis not present

## 2017-01-20 DIAGNOSIS — L720 Epidermal cyst: Secondary | ICD-10-CM | POA: Diagnosis not present

## 2017-01-20 DIAGNOSIS — L821 Other seborrheic keratosis: Secondary | ICD-10-CM | POA: Diagnosis not present

## 2017-02-08 DIAGNOSIS — E876 Hypokalemia: Secondary | ICD-10-CM | POA: Diagnosis not present

## 2017-05-12 DIAGNOSIS — M25562 Pain in left knee: Secondary | ICD-10-CM | POA: Diagnosis not present

## 2017-05-12 DIAGNOSIS — M545 Low back pain: Secondary | ICD-10-CM | POA: Diagnosis not present

## 2017-06-20 DIAGNOSIS — I10 Essential (primary) hypertension: Secondary | ICD-10-CM | POA: Diagnosis not present

## 2017-06-20 DIAGNOSIS — F39 Unspecified mood [affective] disorder: Secondary | ICD-10-CM | POA: Diagnosis not present

## 2017-06-20 DIAGNOSIS — R7303 Prediabetes: Secondary | ICD-10-CM | POA: Diagnosis not present

## 2017-06-20 DIAGNOSIS — E7800 Pure hypercholesterolemia, unspecified: Secondary | ICD-10-CM | POA: Diagnosis not present

## 2017-06-20 DIAGNOSIS — Z23 Encounter for immunization: Secondary | ICD-10-CM | POA: Diagnosis not present

## 2017-06-20 DIAGNOSIS — R079 Chest pain, unspecified: Secondary | ICD-10-CM | POA: Diagnosis not present

## 2017-07-27 ENCOUNTER — Ambulatory Visit
Admission: RE | Admit: 2017-07-27 | Discharge: 2017-07-27 | Disposition: A | Discharge status: Home / Self Care | Payer: Medicare Other | Source: Ambulatory Visit | Attending: Family Medicine | Admitting: Family Medicine

## 2017-07-27 ENCOUNTER — Other Ambulatory Visit: Payer: Self-pay | Admitting: Family Medicine

## 2017-07-27 DIAGNOSIS — M940 Chondrocostal junction syndrome [Tietze]: Secondary | ICD-10-CM | POA: Diagnosis not present

## 2017-07-27 DIAGNOSIS — R52 Pain, unspecified: Secondary | ICD-10-CM

## 2017-07-27 DIAGNOSIS — R0789 Other chest pain: Secondary | ICD-10-CM | POA: Diagnosis not present

## 2017-08-23 DIAGNOSIS — J069 Acute upper respiratory infection, unspecified: Secondary | ICD-10-CM | POA: Diagnosis not present

## 2017-10-30 DIAGNOSIS — H5789 Other specified disorders of eye and adnexa: Secondary | ICD-10-CM | POA: Diagnosis not present

## 2017-10-30 DIAGNOSIS — H524 Presbyopia: Secondary | ICD-10-CM | POA: Diagnosis not present

## 2017-10-30 DIAGNOSIS — H5213 Myopia, bilateral: Secondary | ICD-10-CM | POA: Diagnosis not present

## 2017-10-30 DIAGNOSIS — H52203 Unspecified astigmatism, bilateral: Secondary | ICD-10-CM | POA: Diagnosis not present

## 2017-10-30 DIAGNOSIS — H5371 Glare sensitivity: Secondary | ICD-10-CM | POA: Diagnosis not present

## 2017-10-30 DIAGNOSIS — Z961 Presence of intraocular lens: Secondary | ICD-10-CM | POA: Diagnosis not present

## 2018-01-11 DIAGNOSIS — M5415 Radiculopathy, thoracolumbar region: Secondary | ICD-10-CM | POA: Diagnosis not present

## 2018-01-11 DIAGNOSIS — G43109 Migraine with aura, not intractable, without status migrainosus: Secondary | ICD-10-CM | POA: Diagnosis not present

## 2018-01-11 DIAGNOSIS — E7800 Pure hypercholesterolemia, unspecified: Secondary | ICD-10-CM | POA: Diagnosis not present

## 2018-01-11 DIAGNOSIS — I10 Essential (primary) hypertension: Secondary | ICD-10-CM | POA: Diagnosis not present

## 2018-01-11 DIAGNOSIS — F39 Unspecified mood [affective] disorder: Secondary | ICD-10-CM | POA: Diagnosis not present

## 2018-01-11 DIAGNOSIS — Z1389 Encounter for screening for other disorder: Secondary | ICD-10-CM | POA: Diagnosis not present

## 2018-01-11 DIAGNOSIS — J387 Other diseases of larynx: Secondary | ICD-10-CM | POA: Diagnosis not present

## 2018-01-11 DIAGNOSIS — Z0000 Encounter for general adult medical examination without abnormal findings: Secondary | ICD-10-CM | POA: Diagnosis not present

## 2018-01-11 DIAGNOSIS — J309 Allergic rhinitis, unspecified: Secondary | ICD-10-CM | POA: Diagnosis not present

## 2018-01-18 ENCOUNTER — Other Ambulatory Visit: Payer: Self-pay | Admitting: Family Medicine

## 2018-01-18 DIAGNOSIS — Z1231 Encounter for screening mammogram for malignant neoplasm of breast: Secondary | ICD-10-CM

## 2018-01-22 DIAGNOSIS — L821 Other seborrheic keratosis: Secondary | ICD-10-CM | POA: Diagnosis not present

## 2018-02-07 ENCOUNTER — Ambulatory Visit
Admission: RE | Admit: 2018-02-07 | Discharge: 2018-02-07 | Disposition: A | Discharge status: Home / Self Care | Payer: Medicare Other | Source: Ambulatory Visit | Attending: Family Medicine | Admitting: Family Medicine

## 2018-02-07 DIAGNOSIS — Z1231 Encounter for screening mammogram for malignant neoplasm of breast: Secondary | ICD-10-CM | POA: Diagnosis not present

## 2018-03-05 DIAGNOSIS — L820 Inflamed seborrheic keratosis: Secondary | ICD-10-CM | POA: Diagnosis not present

## 2018-03-05 DIAGNOSIS — L821 Other seborrheic keratosis: Secondary | ICD-10-CM | POA: Diagnosis not present

## 2018-03-05 DIAGNOSIS — L720 Epidermal cyst: Secondary | ICD-10-CM | POA: Diagnosis not present

## 2018-03-05 DIAGNOSIS — L570 Actinic keratosis: Secondary | ICD-10-CM | POA: Diagnosis not present

## 2018-04-10 DIAGNOSIS — M549 Dorsalgia, unspecified: Secondary | ICD-10-CM | POA: Diagnosis not present

## 2018-05-09 DIAGNOSIS — M542 Cervicalgia: Secondary | ICD-10-CM | POA: Diagnosis not present

## 2018-06-20 DIAGNOSIS — Z961 Presence of intraocular lens: Secondary | ICD-10-CM | POA: Diagnosis not present

## 2018-06-20 DIAGNOSIS — H26493 Other secondary cataract, bilateral: Secondary | ICD-10-CM | POA: Diagnosis not present

## 2018-06-27 DIAGNOSIS — H26491 Other secondary cataract, right eye: Secondary | ICD-10-CM | POA: Diagnosis not present

## 2018-07-04 DIAGNOSIS — H26492 Other secondary cataract, left eye: Secondary | ICD-10-CM | POA: Diagnosis not present

## 2018-07-16 DIAGNOSIS — Z23 Encounter for immunization: Secondary | ICD-10-CM | POA: Diagnosis not present

## 2018-07-16 DIAGNOSIS — I10 Essential (primary) hypertension: Secondary | ICD-10-CM | POA: Diagnosis not present

## 2018-07-16 DIAGNOSIS — E7800 Pure hypercholesterolemia, unspecified: Secondary | ICD-10-CM | POA: Diagnosis not present

## 2018-07-16 DIAGNOSIS — E871 Hypo-osmolality and hyponatremia: Secondary | ICD-10-CM | POA: Diagnosis not present

## 2018-07-16 DIAGNOSIS — R7309 Other abnormal glucose: Secondary | ICD-10-CM | POA: Diagnosis not present

## 2018-09-12 DIAGNOSIS — R109 Unspecified abdominal pain: Secondary | ICD-10-CM | POA: Diagnosis not present

## 2018-09-12 DIAGNOSIS — K5792 Diverticulitis of intestine, part unspecified, without perforation or abscess without bleeding: Secondary | ICD-10-CM | POA: Diagnosis not present

## 2018-09-25 ENCOUNTER — Other Ambulatory Visit: Payer: Self-pay | Admitting: Family Medicine

## 2018-09-25 DIAGNOSIS — R1031 Right lower quadrant pain: Secondary | ICD-10-CM

## 2018-09-27 ENCOUNTER — Ambulatory Visit
Admission: RE | Admit: 2018-09-27 | Discharge: 2018-09-27 | Disposition: A | Discharge status: Home / Self Care | Payer: Medicare Other | Source: Ambulatory Visit | Attending: Family Medicine | Admitting: Family Medicine

## 2018-09-27 DIAGNOSIS — K449 Diaphragmatic hernia without obstruction or gangrene: Secondary | ICD-10-CM | POA: Diagnosis not present

## 2018-09-27 DIAGNOSIS — R1031 Right lower quadrant pain: Secondary | ICD-10-CM

## 2018-09-27 DIAGNOSIS — K5730 Diverticulosis of large intestine without perforation or abscess without bleeding: Secondary | ICD-10-CM | POA: Diagnosis not present

## 2018-09-27 MED ORDER — IOPAMIDOL (ISOVUE-300) INJECTION 61%
100.0000 mL | Freq: Once | INTRAVENOUS | Status: AC | PRN
  Administered 2018-09-27: 100 mL via INTRAVENOUS

## 2019-01-23 DIAGNOSIS — Z1211 Encounter for screening for malignant neoplasm of colon: Secondary | ICD-10-CM | POA: Diagnosis not present

## 2019-01-23 DIAGNOSIS — G43109 Migraine with aura, not intractable, without status migrainosus: Secondary | ICD-10-CM | POA: Diagnosis not present

## 2019-01-23 DIAGNOSIS — M5415 Radiculopathy, thoracolumbar region: Secondary | ICD-10-CM | POA: Diagnosis not present

## 2019-01-23 DIAGNOSIS — K5792 Diverticulitis of intestine, part unspecified, without perforation or abscess without bleeding: Secondary | ICD-10-CM | POA: Diagnosis not present

## 2019-01-23 DIAGNOSIS — J309 Allergic rhinitis, unspecified: Secondary | ICD-10-CM | POA: Diagnosis not present

## 2019-01-23 DIAGNOSIS — Z1389 Encounter for screening for other disorder: Secondary | ICD-10-CM | POA: Diagnosis not present

## 2019-01-23 DIAGNOSIS — I10 Essential (primary) hypertension: Secondary | ICD-10-CM | POA: Diagnosis not present

## 2019-01-23 DIAGNOSIS — Z0000 Encounter for general adult medical examination without abnormal findings: Secondary | ICD-10-CM | POA: Diagnosis not present

## 2019-01-23 DIAGNOSIS — J387 Other diseases of larynx: Secondary | ICD-10-CM | POA: Diagnosis not present

## 2019-01-23 DIAGNOSIS — R7303 Prediabetes: Secondary | ICD-10-CM | POA: Diagnosis not present

## 2019-01-23 DIAGNOSIS — E7800 Pure hypercholesterolemia, unspecified: Secondary | ICD-10-CM | POA: Diagnosis not present

## 2019-01-23 DIAGNOSIS — F39 Unspecified mood [affective] disorder: Secondary | ICD-10-CM | POA: Diagnosis not present

## 2019-02-06 ENCOUNTER — Other Ambulatory Visit: Payer: Self-pay | Admitting: Family Medicine

## 2019-02-06 DIAGNOSIS — Z1231 Encounter for screening mammogram for malignant neoplasm of breast: Secondary | ICD-10-CM

## 2019-02-06 DIAGNOSIS — M8580 Other specified disorders of bone density and structure, unspecified site: Secondary | ICD-10-CM

## 2019-04-18 ENCOUNTER — Other Ambulatory Visit: Payer: Self-pay

## 2019-04-18 ENCOUNTER — Ambulatory Visit
Admission: RE | Admit: 2019-04-18 | Discharge: 2019-04-18 | Disposition: A | Discharge status: Home / Self Care | Payer: Medicare Other | Source: Ambulatory Visit | Attending: Family Medicine | Admitting: Family Medicine

## 2019-04-18 DIAGNOSIS — Z1231 Encounter for screening mammogram for malignant neoplasm of breast: Secondary | ICD-10-CM

## 2019-04-18 DIAGNOSIS — M85852 Other specified disorders of bone density and structure, left thigh: Secondary | ICD-10-CM | POA: Diagnosis not present

## 2019-04-18 DIAGNOSIS — Z780 Asymptomatic menopausal state: Secondary | ICD-10-CM | POA: Diagnosis not present

## 2019-04-18 DIAGNOSIS — M8580 Other specified disorders of bone density and structure, unspecified site: Secondary | ICD-10-CM

## 2019-07-05 DIAGNOSIS — R1032 Left lower quadrant pain: Secondary | ICD-10-CM | POA: Diagnosis not present

## 2019-07-05 DIAGNOSIS — Z1211 Encounter for screening for malignant neoplasm of colon: Secondary | ICD-10-CM | POA: Diagnosis not present

## 2019-07-05 DIAGNOSIS — K5792 Diverticulitis of intestine, part unspecified, without perforation or abscess without bleeding: Secondary | ICD-10-CM | POA: Diagnosis not present

## 2019-07-11 DIAGNOSIS — Z1211 Encounter for screening for malignant neoplasm of colon: Secondary | ICD-10-CM | POA: Diagnosis not present

## 2019-07-25 DIAGNOSIS — J387 Other diseases of larynx: Secondary | ICD-10-CM | POA: Diagnosis not present

## 2019-07-25 DIAGNOSIS — R7303 Prediabetes: Secondary | ICD-10-CM | POA: Diagnosis not present

## 2019-07-25 DIAGNOSIS — M5415 Radiculopathy, thoracolumbar region: Secondary | ICD-10-CM | POA: Diagnosis not present

## 2019-07-25 DIAGNOSIS — E7800 Pure hypercholesterolemia, unspecified: Secondary | ICD-10-CM | POA: Diagnosis not present

## 2019-07-25 DIAGNOSIS — I10 Essential (primary) hypertension: Secondary | ICD-10-CM | POA: Diagnosis not present

## 2019-09-10 DIAGNOSIS — R05 Cough: Secondary | ICD-10-CM | POA: Diagnosis not present

## 2019-09-10 DIAGNOSIS — J387 Other diseases of larynx: Secondary | ICD-10-CM | POA: Diagnosis not present

## 2019-09-19 ENCOUNTER — Ambulatory Visit: Payer: Medicare Other

## 2019-09-27 ENCOUNTER — Ambulatory Visit: Payer: Medicare Other | Attending: Internal Medicine

## 2019-09-27 DIAGNOSIS — Z23 Encounter for immunization: Secondary | ICD-10-CM

## 2019-09-27 NOTE — Progress Notes (Signed)
   Covid-19 Vaccination Clinic  Name:  Paula Stephens    MRN: [id number] DOB: 10-15-1935  09/27/2019  Paula Stephens was observed post Covid-19 immunization for 15 minutes without incidence. She was provided with Vaccine Information Sheet and instruction to access the V-Safe system.   Paula Stephens was instructed to call 911 with any severe reactions post vaccine: Marland Kitchen Difficulty breathing  . Swelling of your face and throat  . A fast heartbeat  . A bad rash all over your body  . Dizziness and weakness    Immunizations Administered    Name Date Dose VIS Date Route   Pfizer COVID-19 Vaccine 09/27/2019  2:58 PM 0.3 mL 08/02/2019 Intramuscular   Manufacturer: [place]   Lot: [id number]   [place]: [id number]

## 2019-09-30 ENCOUNTER — Ambulatory Visit: Payer: Medicare Other

## 2019-10-22 ENCOUNTER — Ambulatory Visit: Payer: Medicare Other | Attending: Internal Medicine

## 2019-10-22 DIAGNOSIS — Z23 Encounter for immunization: Secondary | ICD-10-CM

## 2019-10-22 NOTE — Progress Notes (Signed)
   Covid-19 Vaccination Clinic  Name:  Paula Stephens    MRN: [id number] DOB: 05/27/1936  10/22/2019  [name] was observed post Covid-19 immunization for 15 minutes without incident. She was provided with Vaccine Information Sheet and instruction to access the V-Safe system.   [name] was instructed to call 911 with any severe reactions post vaccine: Marland Kitchen Difficulty breathing  . Swelling of face and throat  . A fast heartbeat  . A bad rash all over body  . Dizziness and weakness   Immunizations Administered    Name Date Dose VIS Date Route   Pfizer COVID-19 Vaccine 10/22/2019  2:34 PM 0.3 mL 08/02/2019 Intramuscular   Manufacturer: [place]   Lot: [id number]   [place]: [id number]

## 2019-10-25 DIAGNOSIS — L820 Inflamed seborrheic keratosis: Secondary | ICD-10-CM | POA: Diagnosis not present

## 2019-10-25 DIAGNOSIS — D485 Neoplasm of uncertain behavior of skin: Secondary | ICD-10-CM | POA: Diagnosis not present

## 2019-10-25 DIAGNOSIS — L570 Actinic keratosis: Secondary | ICD-10-CM | POA: Diagnosis not present

## 2019-10-28 DIAGNOSIS — Z961 Presence of intraocular lens: Secondary | ICD-10-CM | POA: Diagnosis not present

## 2019-11-06 DIAGNOSIS — K5790 Diverticulosis of intestine, part unspecified, without perforation or abscess without bleeding: Secondary | ICD-10-CM | POA: Diagnosis not present

## 2019-11-06 DIAGNOSIS — Z1211 Encounter for screening for malignant neoplasm of colon: Secondary | ICD-10-CM | POA: Diagnosis not present

## 2019-11-12 DIAGNOSIS — F39 Unspecified mood [affective] disorder: Secondary | ICD-10-CM | POA: Diagnosis not present

## 2019-11-12 DIAGNOSIS — J069 Acute upper respiratory infection, unspecified: Secondary | ICD-10-CM | POA: Diagnosis not present

## 2019-11-12 DIAGNOSIS — Z1152 Encounter for screening for COVID-19: Secondary | ICD-10-CM | POA: Diagnosis not present

## 2019-11-27 ENCOUNTER — Ambulatory Visit
Admission: RE | Admit: 2019-11-27 | Discharge: 2019-11-27 | Disposition: A | Discharge status: Home / Self Care | Payer: Medicare Other | Source: Ambulatory Visit | Attending: Family Medicine | Admitting: Family Medicine

## 2019-11-27 ENCOUNTER — Other Ambulatory Visit: Payer: Self-pay | Admitting: Family Medicine

## 2019-11-27 DIAGNOSIS — R05 Cough: Secondary | ICD-10-CM

## 2019-11-27 DIAGNOSIS — R059 Cough, unspecified: Secondary | ICD-10-CM

## 2019-11-29 DIAGNOSIS — Z1211 Encounter for screening for malignant neoplasm of colon: Secondary | ICD-10-CM | POA: Diagnosis not present

## 2019-11-29 DIAGNOSIS — Z1212 Encounter for screening for malignant neoplasm of rectum: Secondary | ICD-10-CM | POA: Diagnosis not present

## 2019-12-20 ENCOUNTER — Ambulatory Visit (INDEPENDENT_AMBULATORY_CARE_PROVIDER_SITE_OTHER): Payer: Medicare Other | Admitting: Physician Assistant

## 2019-12-20 ENCOUNTER — Encounter: Payer: Self-pay | Admitting: Physician Assistant

## 2019-12-20 ENCOUNTER — Other Ambulatory Visit: Payer: Self-pay

## 2019-12-20 DIAGNOSIS — L820 Inflamed seborrheic keratosis: Secondary | ICD-10-CM

## 2019-12-20 NOTE — Progress Notes (Signed)
   Follow up Visit  Subjective  Paula Stephens is a 84 y.o. female who presents for the following: Follow-up (Here to recheck places that were LN2 at last visit.  Has one on her left posterior neck she would like refrozen.). Most frozen areas from last visit did well. There are still some that still itch.   Objective  Well appearing patient in no apparent distress; mood and affect are within normal limits.  A focused examination was performed including face, neck, chest and back. Relevant physical exam findings are noted in the Assessment and Plan. No suspicious moles noted on back.   Objective  Left Forehead (3), Mid Back (2), Neck - Posterior: Erythematous stuck-on, waxy papule or plaque.   Assessment & Plan  Inflamed seborrheic keratosis (6) Neck - Posterior; Mid Back (2); Left Forehead (3)  Destruction of lesion - Left Forehead, Mid Back, Neck - Posterior Complexity: simple   Destruction method: cryotherapy   Informed consent: discussed and consent obtained   Timeout:  patient name, date of birth, surgical site, and procedure verified Lesion destroyed using liquid nitrogen: Yes   Outcome: patient tolerated procedure well with no complications    I, Orla Estrin Clark-Bruning, PA-C, have reviewed all documentation for this visit. The documentation on 12/20/19 for the exam, diagnosis, procedures, and orders are all accurate and complete.

## 2020-03-05 DIAGNOSIS — E7800 Pure hypercholesterolemia, unspecified: Secondary | ICD-10-CM | POA: Diagnosis not present

## 2020-03-05 DIAGNOSIS — Z1389 Encounter for screening for other disorder: Secondary | ICD-10-CM | POA: Diagnosis not present

## 2020-03-05 DIAGNOSIS — R7303 Prediabetes: Secondary | ICD-10-CM | POA: Diagnosis not present

## 2020-03-05 DIAGNOSIS — F39 Unspecified mood [affective] disorder: Secondary | ICD-10-CM | POA: Diagnosis not present

## 2020-03-05 DIAGNOSIS — E871 Hypo-osmolality and hyponatremia: Secondary | ICD-10-CM | POA: Diagnosis not present

## 2020-03-05 DIAGNOSIS — I10 Essential (primary) hypertension: Secondary | ICD-10-CM | POA: Diagnosis not present

## 2020-03-05 DIAGNOSIS — M5415 Radiculopathy, thoracolumbar region: Secondary | ICD-10-CM | POA: Diagnosis not present

## 2020-03-05 DIAGNOSIS — J387 Other diseases of larynx: Secondary | ICD-10-CM | POA: Diagnosis not present

## 2020-03-05 DIAGNOSIS — J309 Allergic rhinitis, unspecified: Secondary | ICD-10-CM | POA: Diagnosis not present

## 2020-03-05 DIAGNOSIS — M5116 Intervertebral disc disorders with radiculopathy, lumbar region: Secondary | ICD-10-CM | POA: Diagnosis not present

## 2020-03-05 DIAGNOSIS — Z0000 Encounter for general adult medical examination without abnormal findings: Secondary | ICD-10-CM | POA: Diagnosis not present

## 2020-03-05 DIAGNOSIS — I700 Atherosclerosis of aorta: Secondary | ICD-10-CM | POA: Diagnosis not present

## 2020-04-06 ENCOUNTER — Other Ambulatory Visit: Payer: Self-pay | Admitting: Family Medicine

## 2020-04-06 DIAGNOSIS — Z1231 Encounter for screening mammogram for malignant neoplasm of breast: Secondary | ICD-10-CM

## 2020-04-23 ENCOUNTER — Ambulatory Visit: Payer: Medicare Other

## 2020-04-30 ENCOUNTER — Ambulatory Visit
Admission: RE | Admit: 2020-04-30 | Discharge: 2020-04-30 | Disposition: A | Discharge status: Home / Self Care | Payer: Medicare Other | Source: Ambulatory Visit | Attending: Family Medicine | Admitting: Family Medicine

## 2020-04-30 ENCOUNTER — Other Ambulatory Visit: Payer: Self-pay

## 2020-04-30 DIAGNOSIS — Z1231 Encounter for screening mammogram for malignant neoplasm of breast: Secondary | ICD-10-CM | POA: Diagnosis not present

## 2020-06-29 DIAGNOSIS — R509 Fever, unspecified: Secondary | ICD-10-CM | POA: Diagnosis not present

## 2020-06-29 DIAGNOSIS — Z03818 Encounter for observation for suspected exposure to other biological agents ruled out: Secondary | ICD-10-CM | POA: Diagnosis not present

## 2020-06-30 ENCOUNTER — Ambulatory Visit
Admission: RE | Admit: 2020-06-30 | Discharge: 2020-06-30 | Disposition: A | Discharge status: Home / Self Care | Payer: Medicare Other | Source: Ambulatory Visit | Attending: Family Medicine | Admitting: Family Medicine

## 2020-06-30 ENCOUNTER — Other Ambulatory Visit: Payer: Self-pay | Admitting: Family Medicine

## 2020-06-30 DIAGNOSIS — J45909 Unspecified asthma, uncomplicated: Secondary | ICD-10-CM

## 2020-06-30 DIAGNOSIS — R059 Cough, unspecified: Secondary | ICD-10-CM | POA: Diagnosis not present

## 2020-07-30 DIAGNOSIS — Z23 Encounter for immunization: Secondary | ICD-10-CM | POA: Diagnosis not present

## 2020-08-28 ENCOUNTER — Other Ambulatory Visit (HOSPITAL_COMMUNITY): Payer: Self-pay | Admitting: Radiology

## 2020-08-28 DIAGNOSIS — J45909 Unspecified asthma, uncomplicated: Secondary | ICD-10-CM

## 2020-08-28 DIAGNOSIS — Z87891 Personal history of nicotine dependence: Secondary | ICD-10-CM

## 2020-09-14 DIAGNOSIS — Z01812 Encounter for preprocedural laboratory examination: Secondary | ICD-10-CM | POA: Diagnosis not present

## 2020-09-17 ENCOUNTER — Ambulatory Visit (HOSPITAL_COMMUNITY)
Admission: RE | Admit: 2020-09-17 | Discharge: 2020-09-17 | Disposition: A | Discharge status: Home / Self Care | Payer: Medicare Other | Source: Ambulatory Visit | Attending: Family Medicine | Admitting: Family Medicine

## 2020-09-17 ENCOUNTER — Other Ambulatory Visit: Payer: Self-pay

## 2020-09-17 DIAGNOSIS — Z87891 Personal history of nicotine dependence: Secondary | ICD-10-CM | POA: Insufficient documentation

## 2020-09-17 DIAGNOSIS — J45909 Unspecified asthma, uncomplicated: Secondary | ICD-10-CM | POA: Insufficient documentation

## 2020-09-17 LAB — PULMONARY FUNCTION TEST
DL/VA % pred: 93 %
DL/VA: 3.83 ml/min/mmHg/L
DLCO unc % pred: 78 %
DLCO unc: 14.17 ml/min/mmHg
FEF 25-75 Post: 1.75 L/sec
FEF 25-75 Pre: 1.54 L/sec
FEF2575-%Change-Post: 14 %
FEF2575-%Pred-Post: 140 %
FEF2575-%Pred-Pre: 123 %
FEV1-%Change-Post: 4 %
FEV1-%Pred-Post: 107 %
FEV1-%Pred-Pre: 103 %
FEV1-Post: 1.92 L
FEV1-Pre: 1.84 L
FEV1FVC-%Change-Post: 1 %
FEV1FVC-%Pred-Pre: 105 %
FEV6-%Change-Post: 2 %
FEV6-%Pred-Post: 108 %
FEV6-%Pred-Pre: 105 %
FEV6-Post: 2.45 L
FEV6-Pre: 2.39 L
FEV6FVC-%Change-Post: 0 %
FEV6FVC-%Pred-Post: 106 %
FEV6FVC-%Pred-Pre: 106 %
FVC-%Change-Post: 2 %
FVC-%Pred-Post: 102 %
FVC-%Pred-Pre: 99 %
FVC-Post: 2.45 L
FVC-Pre: 2.39 L
Post FEV1/FVC ratio: 78 %
Post FEV6/FVC ratio: 100 %
Pre FEV1/FVC ratio: 77 %
Pre FEV6/FVC Ratio: 100 %
RV % pred: 117 %
RV: 2.79 L
TLC % pred: 105 %
TLC: 5.17 L

## 2020-09-17 MED ORDER — ALBUTEROL SULFATE (2.5 MG/3ML) 0.083% IN NEBU
2.5000 mg | INHALATION_SOLUTION | Freq: Once | RESPIRATORY_TRACT | Status: AC
  Administered 2020-09-17: 2.5 mg via RESPIRATORY_TRACT

## 2020-09-18 DIAGNOSIS — R0600 Dyspnea, unspecified: Secondary | ICD-10-CM | POA: Diagnosis not present

## 2020-09-18 DIAGNOSIS — Z8619 Personal history of other infectious and parasitic diseases: Secondary | ICD-10-CM | POA: Diagnosis not present

## 2020-09-18 DIAGNOSIS — I10 Essential (primary) hypertension: Secondary | ICD-10-CM | POA: Diagnosis not present

## 2020-09-18 DIAGNOSIS — R7303 Prediabetes: Secondary | ICD-10-CM | POA: Diagnosis not present

## 2020-09-18 DIAGNOSIS — J387 Other diseases of larynx: Secondary | ICD-10-CM | POA: Diagnosis not present

## 2020-09-18 DIAGNOSIS — E7800 Pure hypercholesterolemia, unspecified: Secondary | ICD-10-CM | POA: Diagnosis not present

## 2020-10-27 DIAGNOSIS — Z961 Presence of intraocular lens: Secondary | ICD-10-CM | POA: Diagnosis not present

## 2021-02-05 DIAGNOSIS — M7989 Other specified soft tissue disorders: Secondary | ICD-10-CM | POA: Diagnosis not present

## 2021-02-05 DIAGNOSIS — M545 Low back pain, unspecified: Secondary | ICD-10-CM | POA: Diagnosis not present

## 2021-02-19 DIAGNOSIS — M545 Low back pain, unspecified: Secondary | ICD-10-CM | POA: Diagnosis not present

## 2021-02-19 DIAGNOSIS — M7989 Other specified soft tissue disorders: Secondary | ICD-10-CM | POA: Diagnosis not present

## 2021-04-01 DIAGNOSIS — R7303 Prediabetes: Secondary | ICD-10-CM | POA: Diagnosis not present

## 2021-04-01 DIAGNOSIS — I10 Essential (primary) hypertension: Secondary | ICD-10-CM | POA: Diagnosis not present

## 2021-04-01 DIAGNOSIS — E7800 Pure hypercholesterolemia, unspecified: Secondary | ICD-10-CM | POA: Diagnosis not present

## 2021-04-01 DIAGNOSIS — F39 Unspecified mood [affective] disorder: Secondary | ICD-10-CM | POA: Diagnosis not present

## 2021-04-01 DIAGNOSIS — M5415 Radiculopathy, thoracolumbar region: Secondary | ICD-10-CM | POA: Diagnosis not present

## 2021-04-01 DIAGNOSIS — J387 Other diseases of larynx: Secondary | ICD-10-CM | POA: Diagnosis not present

## 2021-04-01 DIAGNOSIS — Z1389 Encounter for screening for other disorder: Secondary | ICD-10-CM | POA: Diagnosis not present

## 2021-04-01 DIAGNOSIS — J309 Allergic rhinitis, unspecified: Secondary | ICD-10-CM | POA: Diagnosis not present

## 2021-04-01 DIAGNOSIS — G43109 Migraine with aura, not intractable, without status migrainosus: Secondary | ICD-10-CM | POA: Diagnosis not present

## 2021-04-01 DIAGNOSIS — Z0000 Encounter for general adult medical examination without abnormal findings: Secondary | ICD-10-CM | POA: Diagnosis not present

## 2021-04-15 ENCOUNTER — Other Ambulatory Visit: Payer: Self-pay | Admitting: Family Medicine

## 2021-04-15 DIAGNOSIS — Z1231 Encounter for screening mammogram for malignant neoplasm of breast: Secondary | ICD-10-CM

## 2021-05-13 DIAGNOSIS — Z23 Encounter for immunization: Secondary | ICD-10-CM | POA: Diagnosis not present

## 2021-05-13 DIAGNOSIS — M545 Low back pain, unspecified: Secondary | ICD-10-CM | POA: Diagnosis not present

## 2021-05-20 DIAGNOSIS — M545 Low back pain, unspecified: Secondary | ICD-10-CM | POA: Diagnosis not present

## 2021-05-24 DIAGNOSIS — M545 Low back pain, unspecified: Secondary | ICD-10-CM | POA: Diagnosis not present

## 2021-05-26 ENCOUNTER — Other Ambulatory Visit: Payer: Self-pay

## 2021-05-26 ENCOUNTER — Ambulatory Visit
Admission: RE | Admit: 2021-05-26 | Discharge: 2021-05-26 | Disposition: A | Discharge status: Home / Self Care | Payer: Medicare Other | Source: Ambulatory Visit | Attending: Family Medicine | Admitting: Family Medicine

## 2021-05-26 DIAGNOSIS — Z1231 Encounter for screening mammogram for malignant neoplasm of breast: Secondary | ICD-10-CM

## 2021-05-27 DIAGNOSIS — M545 Low back pain, unspecified: Secondary | ICD-10-CM | POA: Diagnosis not present

## 2021-05-31 DIAGNOSIS — M545 Low back pain, unspecified: Secondary | ICD-10-CM | POA: Diagnosis not present

## 2021-06-02 DIAGNOSIS — M545 Low back pain, unspecified: Secondary | ICD-10-CM | POA: Diagnosis not present

## 2021-06-07 DIAGNOSIS — M545 Low back pain, unspecified: Secondary | ICD-10-CM | POA: Diagnosis not present

## 2021-06-09 DIAGNOSIS — M545 Low back pain, unspecified: Secondary | ICD-10-CM | POA: Diagnosis not present

## 2021-06-15 DIAGNOSIS — M545 Low back pain, unspecified: Secondary | ICD-10-CM | POA: Diagnosis not present

## 2021-06-21 DIAGNOSIS — M545 Low back pain, unspecified: Secondary | ICD-10-CM | POA: Diagnosis not present

## 2021-07-09 DIAGNOSIS — J208 Acute bronchitis due to other specified organisms: Secondary | ICD-10-CM | POA: Diagnosis not present

## 2021-07-09 DIAGNOSIS — J387 Other diseases of larynx: Secondary | ICD-10-CM | POA: Diagnosis not present

## 2021-09-09 DIAGNOSIS — R1013 Epigastric pain: Secondary | ICD-10-CM | POA: Diagnosis not present

## 2021-09-09 DIAGNOSIS — K219 Gastro-esophageal reflux disease without esophagitis: Secondary | ICD-10-CM | POA: Diagnosis not present

## 2021-09-10 ENCOUNTER — Other Ambulatory Visit: Payer: Self-pay | Admitting: Physician Assistant

## 2021-09-10 DIAGNOSIS — R1013 Epigastric pain: Secondary | ICD-10-CM

## 2021-09-17 DIAGNOSIS — N281 Cyst of kidney, acquired: Secondary | ICD-10-CM | POA: Diagnosis not present

## 2021-09-17 DIAGNOSIS — R1013 Epigastric pain: Secondary | ICD-10-CM | POA: Diagnosis not present

## 2021-10-05 ENCOUNTER — Ambulatory Visit
Admission: RE | Admit: 2021-10-05 | Discharge: 2021-10-05 | Disposition: A | Discharge status: Home / Self Care | Payer: Medicare Other | Source: Ambulatory Visit | Attending: Family Medicine | Admitting: Family Medicine

## 2021-10-05 ENCOUNTER — Other Ambulatory Visit: Payer: Self-pay

## 2021-10-05 ENCOUNTER — Other Ambulatory Visit: Payer: Self-pay | Admitting: Family Medicine

## 2021-10-05 DIAGNOSIS — M25551 Pain in right hip: Secondary | ICD-10-CM | POA: Diagnosis not present

## 2021-10-05 DIAGNOSIS — I10 Essential (primary) hypertension: Secondary | ICD-10-CM | POA: Diagnosis not present

## 2021-10-05 DIAGNOSIS — F39 Unspecified mood [affective] disorder: Secondary | ICD-10-CM | POA: Diagnosis not present

## 2021-10-05 DIAGNOSIS — E871 Hypo-osmolality and hyponatremia: Secondary | ICD-10-CM | POA: Diagnosis not present

## 2021-10-05 DIAGNOSIS — R7303 Prediabetes: Secondary | ICD-10-CM | POA: Diagnosis not present

## 2021-10-05 DIAGNOSIS — E7800 Pure hypercholesterolemia, unspecified: Secondary | ICD-10-CM | POA: Diagnosis not present

## 2021-10-05 DIAGNOSIS — I700 Atherosclerosis of aorta: Secondary | ICD-10-CM | POA: Diagnosis not present

## 2021-10-05 DIAGNOSIS — M25559 Pain in unspecified hip: Secondary | ICD-10-CM

## 2021-10-07 ENCOUNTER — Other Ambulatory Visit: Payer: Self-pay | Admitting: Internal Medicine

## 2021-10-08 ENCOUNTER — Other Ambulatory Visit: Payer: Self-pay | Admitting: Internal Medicine

## 2021-10-08 ENCOUNTER — Other Ambulatory Visit (HOSPITAL_COMMUNITY): Payer: Self-pay | Admitting: Internal Medicine

## 2021-10-08 DIAGNOSIS — R9389 Abnormal findings on diagnostic imaging of other specified body structures: Secondary | ICD-10-CM

## 2021-10-15 ENCOUNTER — Other Ambulatory Visit: Payer: Self-pay

## 2021-10-15 ENCOUNTER — Ambulatory Visit (HOSPITAL_BASED_OUTPATIENT_CLINIC_OR_DEPARTMENT_OTHER)
Admission: RE | Admit: 2021-10-15 | Discharge: 2021-10-15 | Disposition: A | Discharge status: Home / Self Care | Payer: Medicare Other | Source: Ambulatory Visit | Attending: Internal Medicine | Admitting: Internal Medicine

## 2021-10-15 ENCOUNTER — Other Ambulatory Visit (HOSPITAL_BASED_OUTPATIENT_CLINIC_OR_DEPARTMENT_OTHER): Payer: Medicare Other

## 2021-10-15 DIAGNOSIS — M25551 Pain in right hip: Secondary | ICD-10-CM | POA: Diagnosis not present

## 2021-10-15 DIAGNOSIS — R9389 Abnormal findings on diagnostic imaging of other specified body structures: Secondary | ICD-10-CM | POA: Insufficient documentation

## 2021-10-15 LAB — POCT I-STAT CREATININE: Creatinine, Ser: 0.8 mg/dL (ref 0.44–1.00)

## 2021-10-15 MED ORDER — IOHEXOL 300 MG/ML  SOLN
100.0000 mL | Freq: Once | INTRAMUSCULAR | Status: AC | PRN
  Administered 2021-10-15: 100 mL via INTRAVENOUS

## 2021-10-28 DIAGNOSIS — H52203 Unspecified astigmatism, bilateral: Secondary | ICD-10-CM | POA: Diagnosis not present

## 2021-10-28 DIAGNOSIS — H04123 Dry eye syndrome of bilateral lacrimal glands: Secondary | ICD-10-CM | POA: Diagnosis not present

## 2021-10-28 DIAGNOSIS — Z961 Presence of intraocular lens: Secondary | ICD-10-CM | POA: Diagnosis not present

## 2022-01-23 IMAGING — DX DG CHEST 2V
2 series · 2 of 2 positions shown · non-contrast
Comparison: November 27, 2019.

CLINICAL DATA: Cough.

EXAM:
CHEST - 2 VIEW

[dg chest 2 view (1 of 2)]
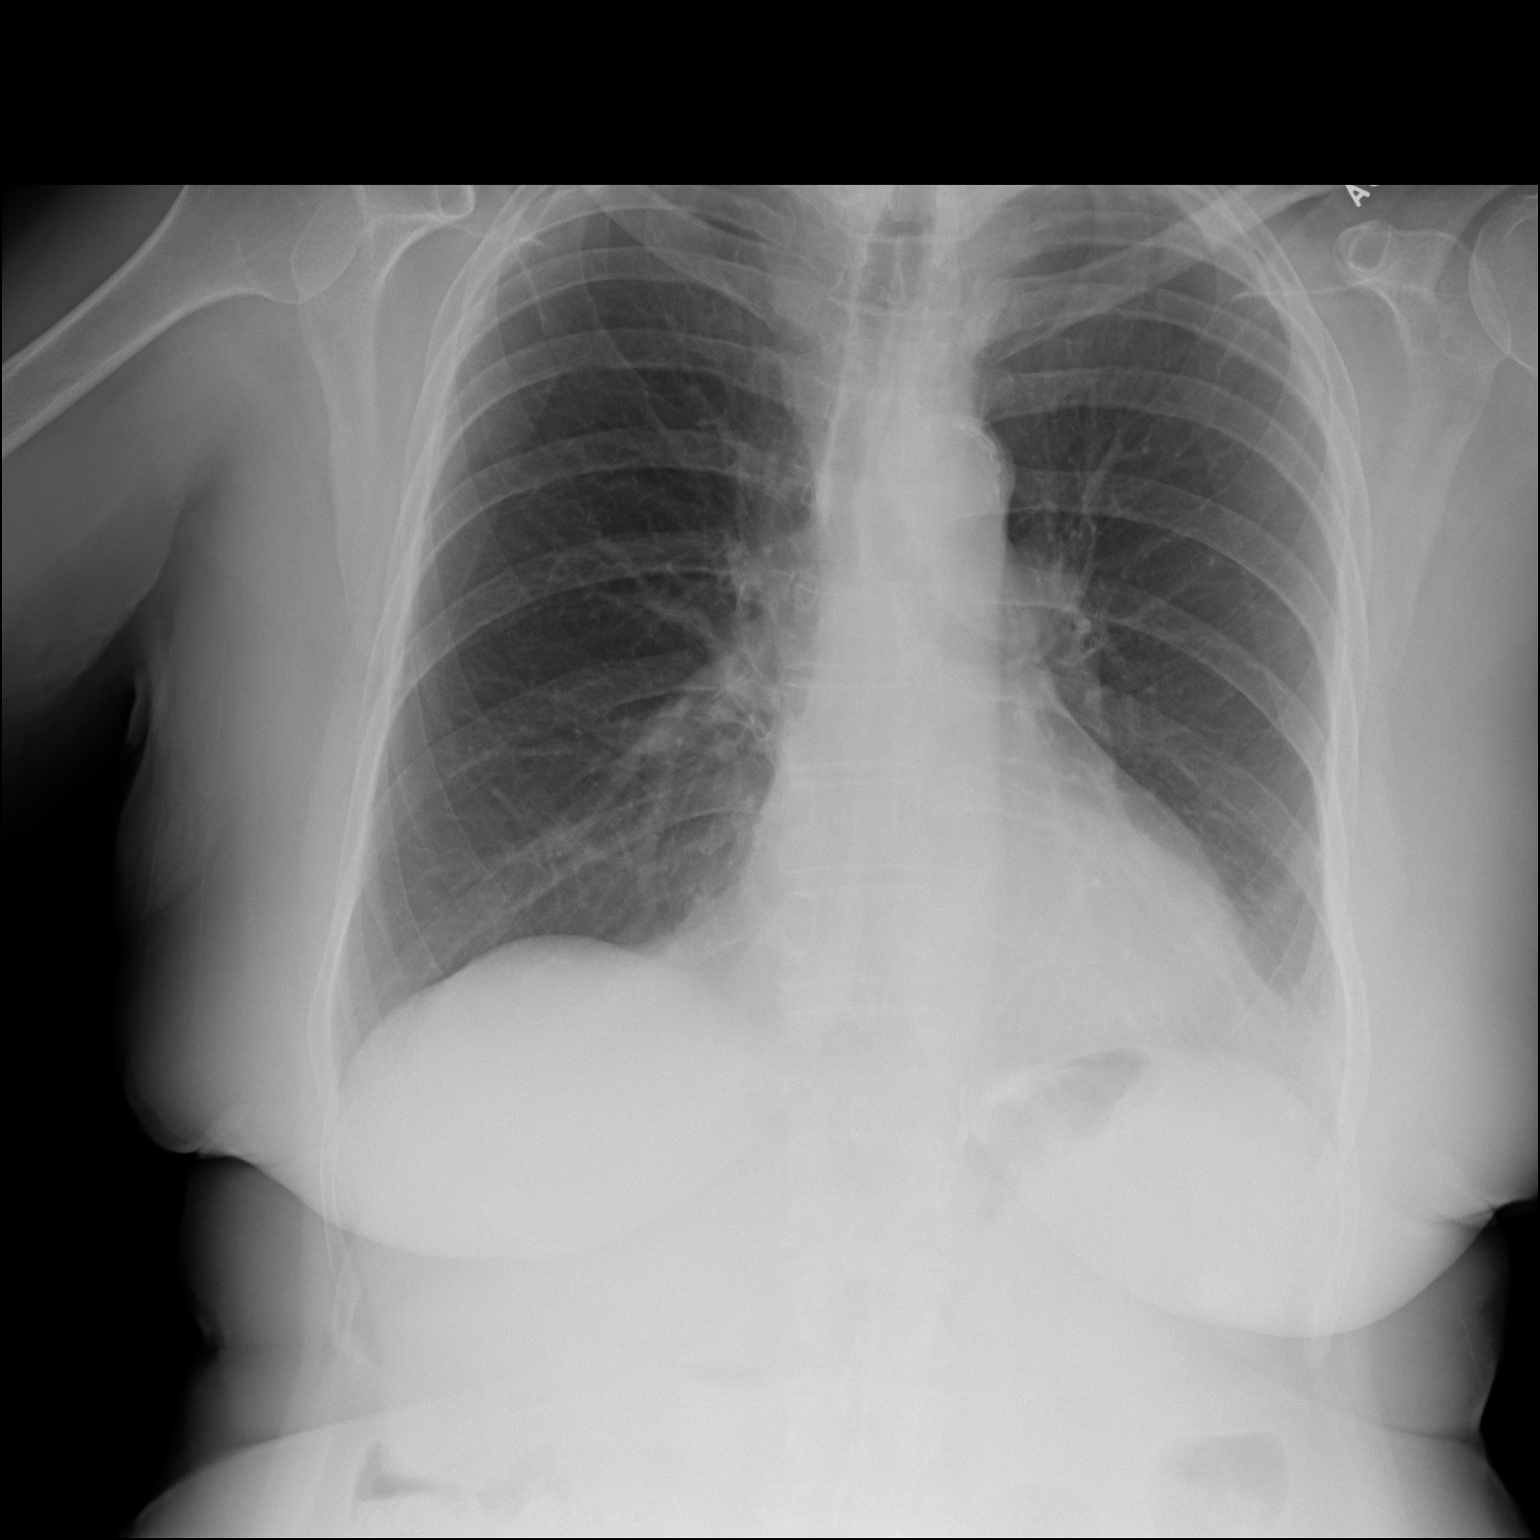

[dg chest 2 view (2 of 2)]
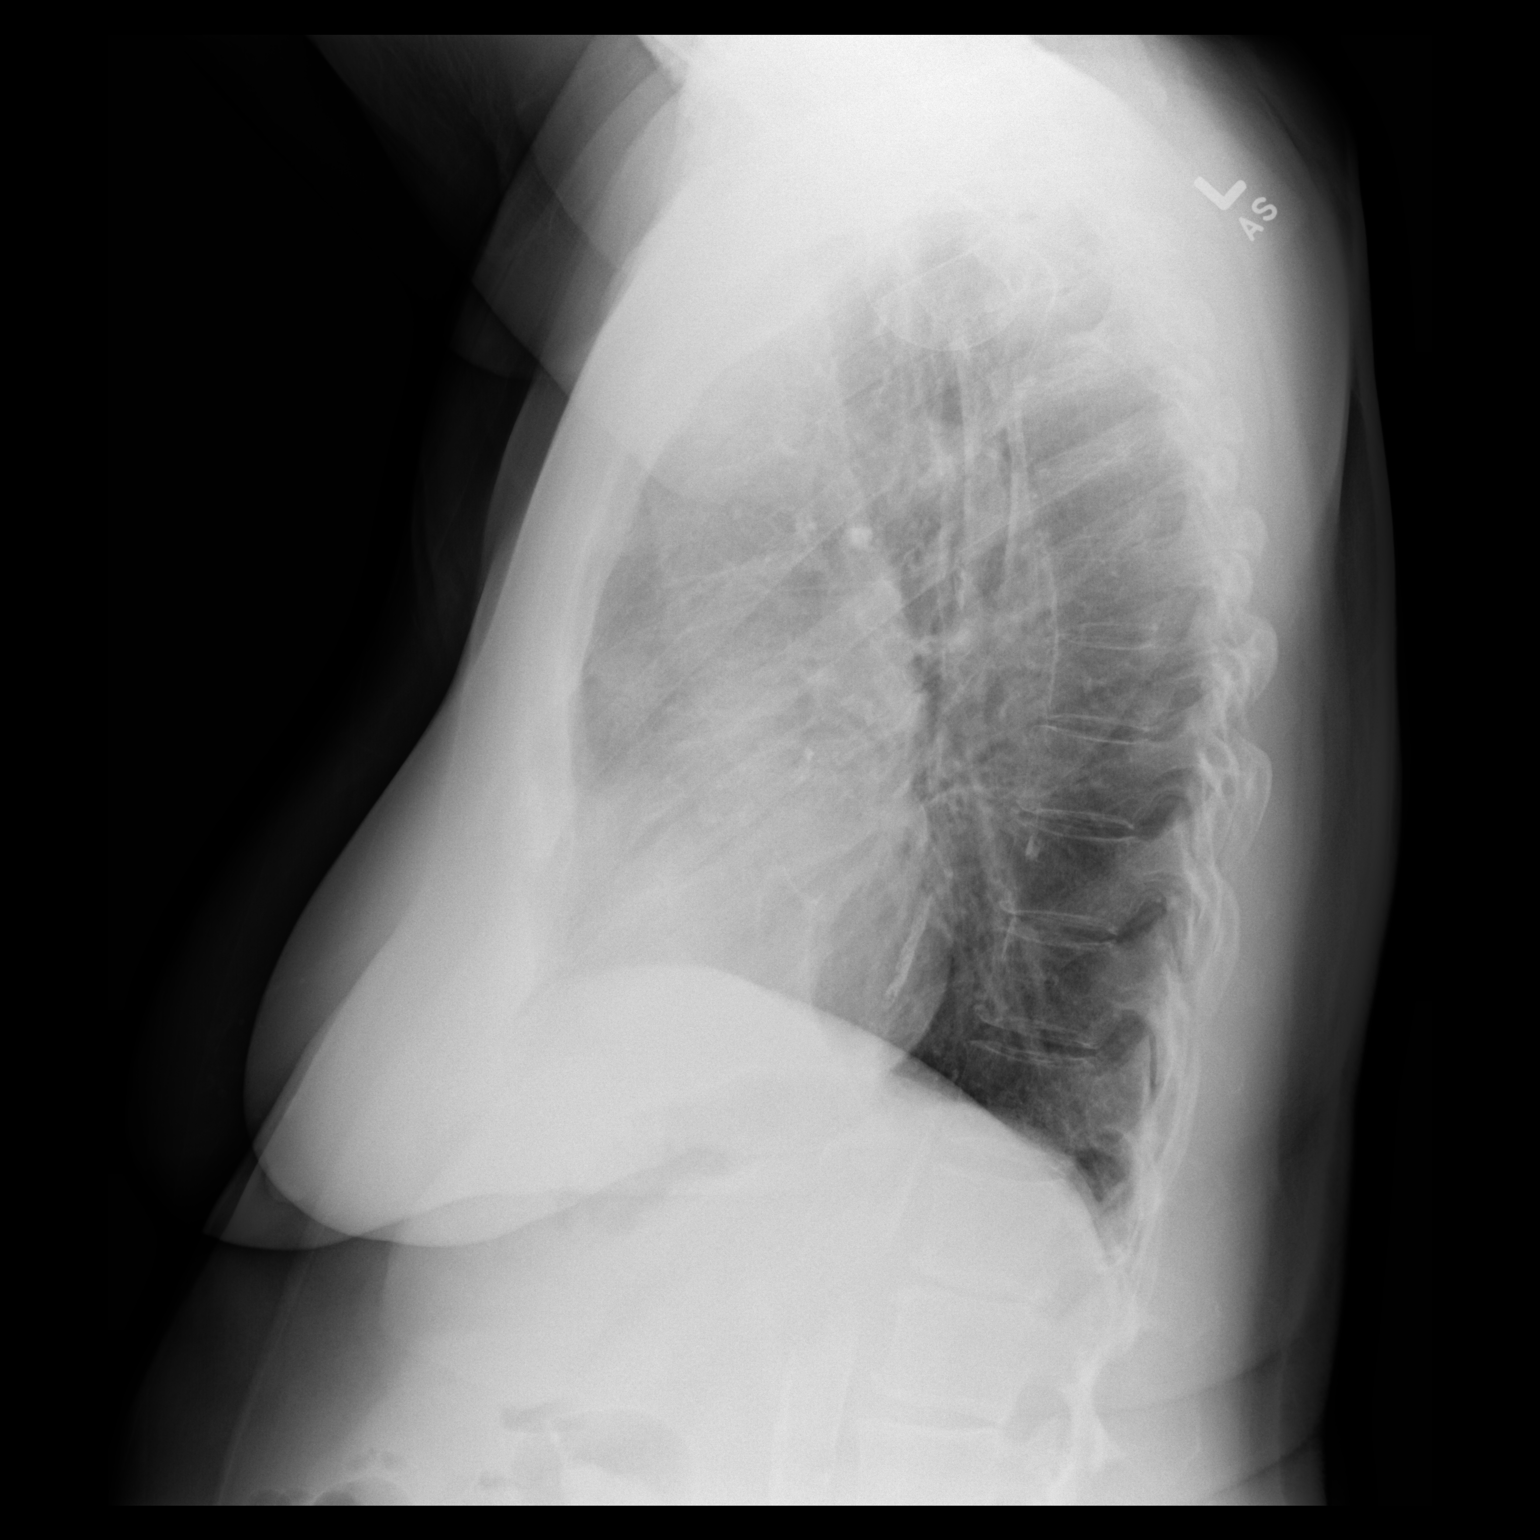

[2 of 2 positions shown; findings below may reference images not displayed]

FINDINGS: The heart size and mediastinal contours are within normal limits.
Both lungs are clear. The visualized skeletal structures are
unremarkable.
IMPRESSION: No active cardiopulmonary disease.

Aortic Atherosclerosis ([id number]).

## 2022-02-14 ENCOUNTER — Other Ambulatory Visit: Payer: Self-pay | Admitting: Internal Medicine

## 2022-02-14 DIAGNOSIS — R600 Localized edema: Secondary | ICD-10-CM | POA: Diagnosis not present

## 2022-02-14 DIAGNOSIS — M25473 Effusion, unspecified ankle: Secondary | ICD-10-CM | POA: Diagnosis not present

## 2022-02-14 DIAGNOSIS — M7989 Other specified soft tissue disorders: Secondary | ICD-10-CM | POA: Diagnosis not present

## 2022-02-15 DIAGNOSIS — M7989 Other specified soft tissue disorders: Secondary | ICD-10-CM | POA: Diagnosis not present

## 2022-03-18 DIAGNOSIS — L03116 Cellulitis of left lower limb: Secondary | ICD-10-CM | POA: Diagnosis not present

## 2022-04-19 ENCOUNTER — Other Ambulatory Visit: Payer: Self-pay | Admitting: Internal Medicine

## 2022-04-19 DIAGNOSIS — R7303 Prediabetes: Secondary | ICD-10-CM | POA: Diagnosis not present

## 2022-04-19 DIAGNOSIS — M8588 Other specified disorders of bone density and structure, other site: Secondary | ICD-10-CM | POA: Diagnosis not present

## 2022-04-19 DIAGNOSIS — E7800 Pure hypercholesterolemia, unspecified: Secondary | ICD-10-CM | POA: Diagnosis not present

## 2022-04-19 DIAGNOSIS — I10 Essential (primary) hypertension: Secondary | ICD-10-CM | POA: Diagnosis not present

## 2022-04-19 DIAGNOSIS — I700 Atherosclerosis of aorta: Secondary | ICD-10-CM | POA: Diagnosis not present

## 2022-04-19 DIAGNOSIS — Z0000 Encounter for general adult medical examination without abnormal findings: Secondary | ICD-10-CM | POA: Diagnosis not present

## 2022-04-19 DIAGNOSIS — J309 Allergic rhinitis, unspecified: Secondary | ICD-10-CM | POA: Diagnosis not present

## 2022-04-19 DIAGNOSIS — M5415 Radiculopathy, thoracolumbar region: Secondary | ICD-10-CM | POA: Diagnosis not present

## 2022-04-19 DIAGNOSIS — K5790 Diverticulosis of intestine, part unspecified, without perforation or abscess without bleeding: Secondary | ICD-10-CM | POA: Diagnosis not present

## 2022-04-19 DIAGNOSIS — G43109 Migraine with aura, not intractable, without status migrainosus: Secondary | ICD-10-CM | POA: Diagnosis not present

## 2022-04-19 DIAGNOSIS — K219 Gastro-esophageal reflux disease without esophagitis: Secondary | ICD-10-CM | POA: Diagnosis not present

## 2022-04-19 DIAGNOSIS — F39 Unspecified mood [affective] disorder: Secondary | ICD-10-CM | POA: Diagnosis not present

## 2022-05-11 ENCOUNTER — Other Ambulatory Visit: Payer: Self-pay | Admitting: Internal Medicine

## 2022-05-11 DIAGNOSIS — Z1231 Encounter for screening mammogram for malignant neoplasm of breast: Secondary | ICD-10-CM

## 2022-05-30 DIAGNOSIS — Z780 Asymptomatic menopausal state: Secondary | ICD-10-CM | POA: Diagnosis not present

## 2022-05-30 DIAGNOSIS — M85852 Other specified disorders of bone density and structure, left thigh: Secondary | ICD-10-CM | POA: Diagnosis not present

## 2022-05-30 DIAGNOSIS — Z1231 Encounter for screening mammogram for malignant neoplasm of breast: Secondary | ICD-10-CM | POA: Diagnosis not present

## 2022-05-30 DIAGNOSIS — M85851 Other specified disorders of bone density and structure, right thigh: Secondary | ICD-10-CM | POA: Diagnosis not present

## 2022-06-07 ENCOUNTER — Ambulatory Visit: Payer: Medicare Other

## 2022-06-20 DIAGNOSIS — H10503 Unspecified blepharoconjunctivitis, bilateral: Secondary | ICD-10-CM | POA: Diagnosis not present

## 2022-08-01 DIAGNOSIS — M25511 Pain in right shoulder: Secondary | ICD-10-CM | POA: Diagnosis not present

## 2022-08-01 DIAGNOSIS — M545 Low back pain, unspecified: Secondary | ICD-10-CM | POA: Diagnosis not present

## 2022-08-01 DIAGNOSIS — M79601 Pain in right arm: Secondary | ICD-10-CM | POA: Diagnosis not present

## 2022-08-01 DIAGNOSIS — G8929 Other chronic pain: Secondary | ICD-10-CM | POA: Diagnosis not present

## 2022-08-31 DIAGNOSIS — M25512 Pain in left shoulder: Secondary | ICD-10-CM | POA: Diagnosis not present

## 2022-08-31 DIAGNOSIS — M25511 Pain in right shoulder: Secondary | ICD-10-CM | POA: Diagnosis not present

## 2022-09-06 DIAGNOSIS — M25512 Pain in left shoulder: Secondary | ICD-10-CM | POA: Diagnosis not present

## 2022-09-06 DIAGNOSIS — M25511 Pain in right shoulder: Secondary | ICD-10-CM | POA: Diagnosis not present

## 2022-09-08 DIAGNOSIS — M25511 Pain in right shoulder: Secondary | ICD-10-CM | POA: Diagnosis not present

## 2022-09-08 DIAGNOSIS — M25512 Pain in left shoulder: Secondary | ICD-10-CM | POA: Diagnosis not present

## 2022-09-12 DIAGNOSIS — M25512 Pain in left shoulder: Secondary | ICD-10-CM | POA: Diagnosis not present

## 2022-09-12 DIAGNOSIS — M25511 Pain in right shoulder: Secondary | ICD-10-CM | POA: Diagnosis not present

## 2022-09-14 DIAGNOSIS — M25511 Pain in right shoulder: Secondary | ICD-10-CM | POA: Diagnosis not present

## 2022-09-14 DIAGNOSIS — M25512 Pain in left shoulder: Secondary | ICD-10-CM | POA: Diagnosis not present

## 2022-09-21 DIAGNOSIS — M25511 Pain in right shoulder: Secondary | ICD-10-CM | POA: Diagnosis not present

## 2022-09-21 DIAGNOSIS — M25512 Pain in left shoulder: Secondary | ICD-10-CM | POA: Diagnosis not present

## 2022-10-20 ENCOUNTER — Other Ambulatory Visit: Payer: Medicare Other

## 2022-10-20 DIAGNOSIS — I700 Atherosclerosis of aorta: Secondary | ICD-10-CM | POA: Diagnosis not present

## 2022-10-20 DIAGNOSIS — E7800 Pure hypercholesterolemia, unspecified: Secondary | ICD-10-CM | POA: Diagnosis not present

## 2022-10-20 DIAGNOSIS — F39 Unspecified mood [affective] disorder: Secondary | ICD-10-CM | POA: Diagnosis not present

## 2022-10-20 DIAGNOSIS — I10 Essential (primary) hypertension: Secondary | ICD-10-CM | POA: Diagnosis not present

## 2022-10-20 DIAGNOSIS — R0982 Postnasal drip: Secondary | ICD-10-CM | POA: Diagnosis not present

## 2022-10-20 DIAGNOSIS — R7303 Prediabetes: Secondary | ICD-10-CM | POA: Diagnosis not present

## 2022-10-20 DIAGNOSIS — K219 Gastro-esophageal reflux disease without esophagitis: Secondary | ICD-10-CM | POA: Diagnosis not present

## 2022-11-16 DIAGNOSIS — K219 Gastro-esophageal reflux disease without esophagitis: Secondary | ICD-10-CM | POA: Diagnosis not present

## 2022-11-16 DIAGNOSIS — R053 Chronic cough: Secondary | ICD-10-CM | POA: Diagnosis not present

## 2022-11-23 ENCOUNTER — Other Ambulatory Visit (HOSPITAL_BASED_OUTPATIENT_CLINIC_OR_DEPARTMENT_OTHER): Payer: Self-pay | Admitting: Internal Medicine

## 2022-11-23 DIAGNOSIS — R053 Chronic cough: Secondary | ICD-10-CM

## 2022-11-25 ENCOUNTER — Ambulatory Visit (HOSPITAL_BASED_OUTPATIENT_CLINIC_OR_DEPARTMENT_OTHER)
Admission: RE | Admit: 2022-11-25 | Discharge: 2022-11-25 | Disposition: A | Discharge status: Home / Self Care | Payer: Medicare Other | Source: Ambulatory Visit | Attending: Internal Medicine | Admitting: Internal Medicine

## 2022-11-25 DIAGNOSIS — R053 Chronic cough: Secondary | ICD-10-CM | POA: Diagnosis not present

## 2022-11-25 DIAGNOSIS — R918 Other nonspecific abnormal finding of lung field: Secondary | ICD-10-CM | POA: Diagnosis not present

## 2022-11-25 MED ORDER — IOHEXOL 300 MG/ML  SOLN
100.0000 mL | Freq: Once | INTRAMUSCULAR | Status: AC | PRN
  Administered 2022-11-25: 75 mL via INTRAVENOUS

## 2022-12-04 ENCOUNTER — Emergency Department (HOSPITAL_BASED_OUTPATIENT_CLINIC_OR_DEPARTMENT_OTHER)
Admission: EM | Admit: 2022-12-04 | Discharge: 2022-12-04 | Disposition: A | Discharge status: Home / Self Care | Payer: Medicare Other | Attending: Emergency Medicine | Admitting: Emergency Medicine

## 2022-12-04 ENCOUNTER — Other Ambulatory Visit: Payer: Self-pay

## 2022-12-04 DIAGNOSIS — R09A2 Foreign body sensation, throat: Secondary | ICD-10-CM | POA: Diagnosis not present

## 2022-12-04 DIAGNOSIS — F458 Other somatoform disorders: Secondary | ICD-10-CM | POA: Diagnosis not present

## 2022-12-04 DIAGNOSIS — R0989 Other specified symptoms and signs involving the circulatory and respiratory systems: Secondary | ICD-10-CM | POA: Diagnosis not present

## 2022-12-04 DIAGNOSIS — R059 Cough, unspecified: Secondary | ICD-10-CM | POA: Insufficient documentation

## 2022-12-04 MED ORDER — PREDNISONE 10 MG (21) PO TBPK: ORAL_TABLET | Freq: Every day | ORAL | 0 refills | Status: AC

## 2022-12-04 MED ORDER — PREDNISONE 10 MG (21) PO TBPK: ORAL_TABLET | Freq: Every day | ORAL | 0 refills | Status: DC

## 2022-12-04 MED ORDER — GUAIFENESIN-CODEINE 100-10 MG/5ML PO SOLN: 5.0000 mL | Freq: Three times a day (TID) | ORAL | 0 refills | Status: DC | PRN

## 2022-12-04 MED ORDER — GUAIFENESIN-CODEINE 100-10 MG/5ML PO SOLN: 5.0000 mL | Freq: Three times a day (TID) | ORAL | 0 refills | Status: AC | PRN

## 2022-12-04 MED ORDER — DEXAMETHASONE 4 MG PO TABS
8.0000 mg | ORAL_TABLET | Freq: Once | ORAL | Status: AC
  Administered 2022-12-04: 8 mg via ORAL
  Filled 2022-12-04: qty 2

## 2022-12-04 NOTE — ED Provider Notes (Signed)
[place] EMERGENCY DEPARTMENT AT [facility] Provider Note   CSN: [id number] Arrival date & time: 12/04/22  1841     History Chief Complaint  Patient presents with   Cough    HPI Paula Stephens is a 87 y.o. female presenting for 3 weeks of cough.  She states that she is having a globus sensation where she feels like she cannot stop coughing despite Tessalon administration, inhaled bronchodilators, inhaled corticosteroids.  She still having a cough every 5 to 6 seconds patient feels like there is something in her posterior oropharynx and that the tightness is in her throat not her chest.  Denies fevers chills nausea vomiting shortness of breath.  Otherwise ambulatory tolerating p.o. intake.  Endorses a history of a similar episode like this in the past which was found to be chronic bronchitis that resolved with a steroid burst.  States she came in tonight because she could not tolerate the ongoing symptoms.   Patient's recorded medical, surgical, social, medication list and allergies were reviewed in the Snapshot window as part of the initial history.   Review of Systems   Review of Systems  Constitutional:  Negative for chills and fever.  HENT:  Negative for ear pain and sore throat.   Eyes:  Negative for pain and visual disturbance.  Respiratory:  Positive for cough. Negative for shortness of breath.   Cardiovascular:  Negative for chest pain and palpitations.  Gastrointestinal:  Negative for abdominal pain and vomiting.  Genitourinary:  Negative for dysuria and hematuria.  Musculoskeletal:  Negative for arthralgias and back pain.  Skin:  Negative for color change and rash.  Neurological:  Negative for seizures and syncope.  All other systems reviewed and are negative.   Physical Exam Updated Vital Signs BP (!) 156/63   Pulse 66   Temp 98.7 F (37.1 C) (Oral)   Resp 18   SpO2 97%  Physical Exam Vitals and nursing note reviewed.  Constitutional:       General: She is not in acute distress.    Appearance: She is well-developed.  HENT:     Head: Normocephalic and atraumatic.  Eyes:     Conjunctiva/sclera: Conjunctivae normal.  Cardiovascular:     Rate and Rhythm: Normal rate and regular rhythm.     Heart sounds: No murmur heard. Pulmonary:     Effort: Pulmonary effort is normal. No respiratory distress.     Breath sounds: Normal breath sounds.  Abdominal:     General: There is no distension.     Palpations: Abdomen is soft.     Tenderness: There is no abdominal tenderness. There is no right CVA tenderness or left CVA tenderness.  Musculoskeletal:        General: No swelling or tenderness. Normal range of motion.     Cervical back: Neck supple.  Skin:    General: Skin is warm and dry.  Neurological:     General: No focal deficit present.     Mental Status: She is alert and oriented to person, place, and time. Mental status is at baseline.     Cranial Nerves: No cranial nerve deficit.      ED Course/ Medical Decision Making/ A&P    Procedures Procedures   Medications Ordered in ED Medications  dexamethasone (DECADRON) tablet 8 mg (8 mg Oral Given 12/04/22 1909)    Medical Decision Making:    Paula Stephens is a 87 y.o. female who presented to the ED today with chronic cough  detailed above.    Patient states that send not since the last time she saw her PCP she has had negative PFTs, negative CT chest except for mild lesions thought to be unrelated to her cough and was told by her pulmonologist that this probably is not pulmonary in nature. States that she does not have appointment scheduled with her PCP yet and comes in tonight because of ongoing symptoms.  Patient states she is requesting a low-dose cough medicine to help her tolerate the symptoms in the interim and she would like to trial a steroid burst since I fixed her bronchitis in the past.  We discussed that that would be another pulmonary diagnosis and that if  the pulmonologist does not feel this is the etiology is unlikely to be this.  However, notably patient also endorsing posterior oropharyngeal globus sensation.  On exam she has mild posterior oropharyngeal swelling without obvious abnormality.  This may be related to her chronic cough.  Diagnostically, laryngomalacia may relate to both posterior globus and frequent cough, throat clearing as a potential etiology.  Will trial dexamethasone tonight for therapeutic treatment of posterior oropharyngeal swelling, steroid burst for treatment of her bronchitis symptoms, supportive care otherwise.  Recommended close follow-up with primary care provider.  Referred patient to ear nose and throat for further diagnostic evaluation in outpatient setting to coordinate ongoing evaluation and management. No acute indication for further intervention in the emergency room at this time as there is no acute pathology identified.  Patient stable for outpatient care manage with the above supportive techniques. Clinical Impression:  1. Cough, unspecified type   2. Globus pharyngeus      Discharge   Final Clinical Impression(s) / ED Diagnoses Final diagnoses:  Cough, unspecified type  Globus pharyngeus    Rx / DC Orders ED Discharge Orders          Ordered    Ambulatory referral to ENT        12/04/22 1902    predniSONE (STERAPRED UNI-PAK 21 TAB) 10 MG (21) TBPK tablet  Daily,   Status:  Discontinued        12/04/22 1910    guaiFENesin-codeine 100-10 MG/5ML syrup  3 times daily PRN,   Status:  Discontinued        12/04/22 1910    predniSONE (STERAPRED UNI-PAK 21 TAB) 10 MG (21) TBPK tablet  Daily        12/04/22 2026    guaiFENesin-codeine 100-10 MG/5ML syrup  3 times daily PRN        12/04/22 2026              Glyn Ade, MD 12/04/22 2044

## 2022-12-04 NOTE — ED Triage Notes (Signed)
Patient here POV from home.  Endorses Cough for approximately 2-3 Weeks. States she has a History of a Intermittent Cough over the past few years. Has been prescribed a few medications more recently without much relief.   Feels Congested but is Dry. No Known Fevers.   NAD noted during Triage. A&Ox4. GCS 15. Ambulatory.

## 2022-12-04 NOTE — ED Notes (Signed)
RN reviewed discharge instructions with pt. Pt verbalized understanding and had no further questions. VSS upon discharge.  

## 2022-12-20 ENCOUNTER — Other Ambulatory Visit (HOSPITAL_BASED_OUTPATIENT_CLINIC_OR_DEPARTMENT_OTHER): Payer: Self-pay | Admitting: Family Medicine

## 2022-12-20 DIAGNOSIS — R911 Solitary pulmonary nodule: Secondary | ICD-10-CM | POA: Diagnosis not present

## 2022-12-20 DIAGNOSIS — K219 Gastro-esophageal reflux disease without esophagitis: Secondary | ICD-10-CM | POA: Diagnosis not present

## 2022-12-20 DIAGNOSIS — I10 Essential (primary) hypertension: Secondary | ICD-10-CM | POA: Diagnosis not present

## 2022-12-20 DIAGNOSIS — R053 Chronic cough: Secondary | ICD-10-CM | POA: Diagnosis not present

## 2022-12-20 DIAGNOSIS — Z87891 Personal history of nicotine dependence: Secondary | ICD-10-CM | POA: Diagnosis not present

## 2023-01-04 ENCOUNTER — Encounter: Payer: Self-pay | Admitting: Physician Assistant

## 2023-01-04 DIAGNOSIS — R053 Chronic cough: Secondary | ICD-10-CM | POA: Diagnosis not present

## 2023-01-04 DIAGNOSIS — K219 Gastro-esophageal reflux disease without esophagitis: Secondary | ICD-10-CM | POA: Diagnosis not present

## 2023-01-10 ENCOUNTER — Encounter: Payer: Self-pay | Admitting: Physician Assistant

## 2023-01-23 DIAGNOSIS — J3489 Other specified disorders of nose and nasal sinuses: Secondary | ICD-10-CM | POA: Diagnosis not present

## 2023-01-23 DIAGNOSIS — R053 Chronic cough: Secondary | ICD-10-CM | POA: Diagnosis not present

## 2023-01-25 DIAGNOSIS — L821 Other seborrheic keratosis: Secondary | ICD-10-CM | POA: Diagnosis not present

## 2023-01-25 DIAGNOSIS — L820 Inflamed seborrheic keratosis: Secondary | ICD-10-CM | POA: Diagnosis not present

## 2023-01-25 DIAGNOSIS — L309 Dermatitis, unspecified: Secondary | ICD-10-CM | POA: Diagnosis not present

## 2023-02-22 DIAGNOSIS — R609 Edema, unspecified: Secondary | ICD-10-CM | POA: Diagnosis not present

## 2023-02-22 DIAGNOSIS — R011 Cardiac murmur, unspecified: Secondary | ICD-10-CM | POA: Diagnosis not present

## 2023-02-27 DIAGNOSIS — Z961 Presence of intraocular lens: Secondary | ICD-10-CM | POA: Diagnosis not present

## 2023-02-27 DIAGNOSIS — H43813 Vitreous degeneration, bilateral: Secondary | ICD-10-CM | POA: Diagnosis not present

## 2023-03-03 DIAGNOSIS — R011 Cardiac murmur, unspecified: Secondary | ICD-10-CM | POA: Diagnosis not present

## 2023-03-29 ENCOUNTER — Ambulatory Visit (HOSPITAL_BASED_OUTPATIENT_CLINIC_OR_DEPARTMENT_OTHER)
Admission: RE | Admit: 2023-03-29 | Discharge: 2023-03-29 | Disposition: A | Discharge status: Home / Self Care | Payer: Medicare Other | Source: Ambulatory Visit | Attending: Family Medicine | Admitting: Family Medicine

## 2023-03-29 DIAGNOSIS — R911 Solitary pulmonary nodule: Secondary | ICD-10-CM | POA: Diagnosis not present

## 2023-03-29 DIAGNOSIS — I700 Atherosclerosis of aorta: Secondary | ICD-10-CM | POA: Diagnosis not present

## 2023-03-29 DIAGNOSIS — K449 Diaphragmatic hernia without obstruction or gangrene: Secondary | ICD-10-CM | POA: Diagnosis not present

## 2023-04-27 DIAGNOSIS — K219 Gastro-esophageal reflux disease without esophagitis: Secondary | ICD-10-CM | POA: Diagnosis not present

## 2023-04-27 DIAGNOSIS — M5415 Radiculopathy, thoracolumbar region: Secondary | ICD-10-CM | POA: Diagnosis not present

## 2023-04-27 DIAGNOSIS — Z23 Encounter for immunization: Secondary | ICD-10-CM | POA: Diagnosis not present

## 2023-04-27 DIAGNOSIS — G43109 Migraine with aura, not intractable, without status migrainosus: Secondary | ICD-10-CM | POA: Diagnosis not present

## 2023-04-27 DIAGNOSIS — L989 Disorder of the skin and subcutaneous tissue, unspecified: Secondary | ICD-10-CM | POA: Diagnosis not present

## 2023-04-27 DIAGNOSIS — F39 Unspecified mood [affective] disorder: Secondary | ICD-10-CM | POA: Diagnosis not present

## 2023-04-27 DIAGNOSIS — R7303 Prediabetes: Secondary | ICD-10-CM | POA: Diagnosis not present

## 2023-04-27 DIAGNOSIS — I700 Atherosclerosis of aorta: Secondary | ICD-10-CM | POA: Diagnosis not present

## 2023-04-27 DIAGNOSIS — I10 Essential (primary) hypertension: Secondary | ICD-10-CM | POA: Diagnosis not present

## 2023-04-27 DIAGNOSIS — K5790 Diverticulosis of intestine, part unspecified, without perforation or abscess without bleeding: Secondary | ICD-10-CM | POA: Diagnosis not present

## 2023-04-27 DIAGNOSIS — Z0000 Encounter for general adult medical examination without abnormal findings: Secondary | ICD-10-CM | POA: Diagnosis not present

## 2023-04-27 DIAGNOSIS — E7800 Pure hypercholesterolemia, unspecified: Secondary | ICD-10-CM | POA: Diagnosis not present

## 2023-05-10 DIAGNOSIS — J323 Chronic sphenoidal sinusitis: Secondary | ICD-10-CM | POA: Diagnosis not present

## 2023-05-10 DIAGNOSIS — J343 Hypertrophy of nasal turbinates: Secondary | ICD-10-CM | POA: Diagnosis not present

## 2023-06-08 DIAGNOSIS — D485 Neoplasm of uncertain behavior of skin: Secondary | ICD-10-CM | POA: Diagnosis not present

## 2023-06-08 DIAGNOSIS — Z23 Encounter for immunization: Secondary | ICD-10-CM | POA: Diagnosis not present

## 2023-06-12 DIAGNOSIS — Z1231 Encounter for screening mammogram for malignant neoplasm of breast: Secondary | ICD-10-CM | POA: Diagnosis not present

## 2023-07-05 DIAGNOSIS — Z86007 Personal history of in-situ neoplasm of skin: Secondary | ICD-10-CM | POA: Diagnosis not present

## 2023-07-05 DIAGNOSIS — L570 Actinic keratosis: Secondary | ICD-10-CM | POA: Diagnosis not present

## 2023-07-05 DIAGNOSIS — L821 Other seborrheic keratosis: Secondary | ICD-10-CM | POA: Diagnosis not present

## 2023-07-05 DIAGNOSIS — L578 Other skin changes due to chronic exposure to nonionizing radiation: Secondary | ICD-10-CM | POA: Diagnosis not present

## 2023-07-05 DIAGNOSIS — L814 Other melanin hyperpigmentation: Secondary | ICD-10-CM | POA: Diagnosis not present

## 2023-07-05 DIAGNOSIS — L853 Xerosis cutis: Secondary | ICD-10-CM | POA: Diagnosis not present

## 2023-07-05 DIAGNOSIS — D229 Melanocytic nevi, unspecified: Secondary | ICD-10-CM | POA: Diagnosis not present

## 2023-09-06 DIAGNOSIS — R051 Acute cough: Secondary | ICD-10-CM | POA: Diagnosis not present

## 2023-09-06 DIAGNOSIS — R0981 Nasal congestion: Secondary | ICD-10-CM | POA: Diagnosis not present

## 2023-09-06 DIAGNOSIS — K219 Gastro-esophageal reflux disease without esophagitis: Secondary | ICD-10-CM | POA: Diagnosis not present

## 2023-09-06 DIAGNOSIS — J309 Allergic rhinitis, unspecified: Secondary | ICD-10-CM | POA: Diagnosis not present

## 2023-09-06 DIAGNOSIS — I700 Atherosclerosis of aorta: Secondary | ICD-10-CM | POA: Diagnosis not present

## 2023-09-06 DIAGNOSIS — I10 Essential (primary) hypertension: Secondary | ICD-10-CM | POA: Diagnosis not present

## 2023-09-06 DIAGNOSIS — U071 COVID-19: Secondary | ICD-10-CM | POA: Diagnosis not present

## 2023-09-27 ENCOUNTER — Other Ambulatory Visit: Payer: Self-pay | Admitting: Internal Medicine

## 2023-09-27 ENCOUNTER — Ambulatory Visit
Admission: RE | Admit: 2023-09-27 | Discharge: 2023-09-27 | Disposition: A | Discharge status: Home / Self Care | Payer: Medicare Other | Source: Ambulatory Visit | Attending: Internal Medicine | Admitting: Internal Medicine

## 2023-09-27 DIAGNOSIS — R051 Acute cough: Secondary | ICD-10-CM

## 2023-09-27 DIAGNOSIS — U071 COVID-19: Secondary | ICD-10-CM | POA: Diagnosis not present

## 2023-09-27 DIAGNOSIS — J3489 Other specified disorders of nose and nasal sinuses: Secondary | ICD-10-CM | POA: Diagnosis not present

## 2023-09-27 DIAGNOSIS — R059 Cough, unspecified: Secondary | ICD-10-CM | POA: Diagnosis not present

## 2023-11-02 DIAGNOSIS — K219 Gastro-esophageal reflux disease without esophagitis: Secondary | ICD-10-CM | POA: Diagnosis not present

## 2023-11-02 DIAGNOSIS — E7800 Pure hypercholesterolemia, unspecified: Secondary | ICD-10-CM | POA: Diagnosis not present

## 2023-11-02 DIAGNOSIS — M8588 Other specified disorders of bone density and structure, other site: Secondary | ICD-10-CM | POA: Diagnosis not present

## 2023-11-02 DIAGNOSIS — I10 Essential (primary) hypertension: Secondary | ICD-10-CM | POA: Diagnosis not present

## 2023-11-02 DIAGNOSIS — J309 Allergic rhinitis, unspecified: Secondary | ICD-10-CM | POA: Diagnosis not present

## 2023-11-02 DIAGNOSIS — M25561 Pain in right knee: Secondary | ICD-10-CM | POA: Diagnosis not present

## 2023-11-02 DIAGNOSIS — F39 Unspecified mood [affective] disorder: Secondary | ICD-10-CM | POA: Diagnosis not present

## 2023-11-02 DIAGNOSIS — I700 Atherosclerosis of aorta: Secondary | ICD-10-CM | POA: Diagnosis not present

## 2023-11-02 DIAGNOSIS — M5415 Radiculopathy, thoracolumbar region: Secondary | ICD-10-CM | POA: Diagnosis not present

## 2023-11-20 DIAGNOSIS — I10 Essential (primary) hypertension: Secondary | ICD-10-CM | POA: Diagnosis not present

## 2023-11-20 DIAGNOSIS — E7800 Pure hypercholesterolemia, unspecified: Secondary | ICD-10-CM | POA: Diagnosis not present

## 2023-12-20 DIAGNOSIS — E7800 Pure hypercholesterolemia, unspecified: Secondary | ICD-10-CM | POA: Diagnosis not present

## 2023-12-20 DIAGNOSIS — I10 Essential (primary) hypertension: Secondary | ICD-10-CM | POA: Diagnosis not present

## 2024-01-04 DIAGNOSIS — L570 Actinic keratosis: Secondary | ICD-10-CM | POA: Diagnosis not present

## 2024-01-04 DIAGNOSIS — L659 Nonscarring hair loss, unspecified: Secondary | ICD-10-CM | POA: Diagnosis not present

## 2024-02-27 DIAGNOSIS — M4120 Other idiopathic scoliosis, site unspecified: Secondary | ICD-10-CM | POA: Diagnosis not present

## 2024-02-27 DIAGNOSIS — M5416 Radiculopathy, lumbar region: Secondary | ICD-10-CM | POA: Diagnosis not present

## 2024-02-27 DIAGNOSIS — M1712 Unilateral primary osteoarthritis, left knee: Secondary | ICD-10-CM | POA: Diagnosis not present

## 2024-03-06 DIAGNOSIS — H43813 Vitreous degeneration, bilateral: Secondary | ICD-10-CM | POA: Diagnosis not present

## 2024-03-06 DIAGNOSIS — Z961 Presence of intraocular lens: Secondary | ICD-10-CM | POA: Diagnosis not present

## 2024-03-06 DIAGNOSIS — H04123 Dry eye syndrome of bilateral lacrimal glands: Secondary | ICD-10-CM | POA: Diagnosis not present

## 2024-03-14 DIAGNOSIS — M1712 Unilateral primary osteoarthritis, left knee: Secondary | ICD-10-CM | POA: Diagnosis not present

## 2024-05-07 DIAGNOSIS — I700 Atherosclerosis of aorta: Secondary | ICD-10-CM | POA: Diagnosis not present

## 2024-05-07 DIAGNOSIS — C4492 Squamous cell carcinoma of skin, unspecified: Secondary | ICD-10-CM | POA: Diagnosis not present

## 2024-05-07 DIAGNOSIS — M5415 Radiculopathy, thoracolumbar region: Secondary | ICD-10-CM | POA: Diagnosis not present

## 2024-05-07 DIAGNOSIS — R54 Age-related physical debility: Secondary | ICD-10-CM | POA: Diagnosis not present

## 2024-05-07 DIAGNOSIS — M8588 Other specified disorders of bone density and structure, other site: Secondary | ICD-10-CM | POA: Diagnosis not present

## 2024-05-07 DIAGNOSIS — F39 Unspecified mood [affective] disorder: Secondary | ICD-10-CM | POA: Diagnosis not present

## 2024-05-07 DIAGNOSIS — Z0000 Encounter for general adult medical examination without abnormal findings: Secondary | ICD-10-CM | POA: Diagnosis not present

## 2024-05-07 DIAGNOSIS — I10 Essential (primary) hypertension: Secondary | ICD-10-CM | POA: Diagnosis not present

## 2024-05-07 DIAGNOSIS — E7800 Pure hypercholesterolemia, unspecified: Secondary | ICD-10-CM | POA: Diagnosis not present

## 2024-05-07 DIAGNOSIS — M25561 Pain in right knee: Secondary | ICD-10-CM | POA: Diagnosis not present

## 2024-05-07 DIAGNOSIS — Z23 Encounter for immunization: Secondary | ICD-10-CM | POA: Diagnosis not present

## 2024-05-07 DIAGNOSIS — R7303 Prediabetes: Secondary | ICD-10-CM | POA: Diagnosis not present

## 2024-06-17 DIAGNOSIS — Z1231 Encounter for screening mammogram for malignant neoplasm of breast: Secondary | ICD-10-CM | POA: Diagnosis not present

## 2024-06-17 DIAGNOSIS — M85851 Other specified disorders of bone density and structure, right thigh: Secondary | ICD-10-CM | POA: Diagnosis not present

## 2024-06-17 DIAGNOSIS — M85852 Other specified disorders of bone density and structure, left thigh: Secondary | ICD-10-CM | POA: Diagnosis not present

## 2024-07-09 DIAGNOSIS — M25562 Pain in left knee: Secondary | ICD-10-CM | POA: Diagnosis not present

## 2024-07-09 DIAGNOSIS — M5416 Radiculopathy, lumbar region: Secondary | ICD-10-CM | POA: Diagnosis not present

## 2024-07-25 DIAGNOSIS — M545 Low back pain, unspecified: Secondary | ICD-10-CM | POA: Diagnosis not present

## 2024-08-01 DIAGNOSIS — M545 Low back pain, unspecified: Secondary | ICD-10-CM | POA: Diagnosis not present

## 2024-08-01 DIAGNOSIS — M25562 Pain in left knee: Secondary | ICD-10-CM | POA: Diagnosis not present
